# Patient Record
Sex: Male | Born: 1980
Health system: Southern US, Community
[De-identification: ages and names within clinical notes are randomized; demographics above are authoritative.]

## PROBLEM LIST (undated history)

## (undated) DIAGNOSIS — R2231 Localized swelling, mass and lump, right upper limb: Secondary | ICD-10-CM

## (undated) DIAGNOSIS — F410 Panic disorder [episodic paroxysmal anxiety] without agoraphobia: Secondary | ICD-10-CM

## (undated) DIAGNOSIS — K5900 Constipation, unspecified: Secondary | ICD-10-CM

---

## 2013-02-28 ENCOUNTER — Encounter: Payer: Self-pay | Admitting: Family Medicine

## 2013-02-28 ENCOUNTER — Ambulatory Visit (INDEPENDENT_AMBULATORY_CARE_PROVIDER_SITE_OTHER): Payer: BC Managed Care – PPO | Admitting: Family Medicine

## 2013-02-28 VITALS — BP 132/72 | HR 69 | Temp 98.1°F | Ht 70.0 in | Wt 165.0 lb

## 2013-02-28 DIAGNOSIS — Z0000 Encounter for general adult medical examination without abnormal findings: Secondary | ICD-10-CM

## 2013-02-28 DIAGNOSIS — F329 Major depressive disorder, single episode, unspecified: Secondary | ICD-10-CM

## 2013-02-28 DIAGNOSIS — F32A Depression, unspecified: Secondary | ICD-10-CM | POA: Insufficient documentation

## 2013-02-28 DIAGNOSIS — F3289 Other specified depressive episodes: Secondary | ICD-10-CM

## 2013-02-28 MED ORDER — SERTRALINE HCL 100 MG PO TABS: 100.0000 mg | ORAL_TABLET | Freq: Every day | ORAL | Status: DC

## 2013-02-28 NOTE — Progress Notes (Signed)
  Subjective:    Patient ID: Rodney Ruiz, male    DOB: Jul 15, 1981, 32 y.o.   MRN: [id number]  HPI This 32 y.o. male presents for evaluation of depression. He has been taking zoloft for 13 years and his depression is controlled.  He has not had CPE labs in awhile.  He reports no acute problems.   Review of Systems No chest pain, SOB, HA, dizziness, vision change, N/V, diarrhea, constipation, dysuria, urinary urgency or frequency, myalgias, arthralgias or rash.     Objective:   Physical Exam Vital signs noted  Well developed well nourished male.  HEENT - Head atraumatic Normocephalic                Eyes - PERRLA, Conjuctiva - clear Sclera- Clear EOMI                Ears - EAC's Wnl TM's Wnl Gross Hearing WNL                Nose - Nares patent                 Throat - oropharanx wnl Respiratory - Lungs CTA bilateral Cardiac - RRR S1 and S2 without murmur GI - Abdomen soft Nontender and bowel sounds active x 4 Extremities - No edema. Neuro - Grossly intact.       Assessment & Plan:  Depression - Plan: sertraline (ZOLOFT) 100 MG tablet and controlled.  Routine general medical examination at a health care facility - Plan: POCT CBC, CBC with Differential, Lipid panel, TSH, COMPLETE METABOLIC PANEL WITH GFR.  Discussed doing testicular self exam.  Follow up in one year

## 2013-02-28 NOTE — Patient Instructions (Signed)
Testicular Problems and Self-Exam   Men can examine themselves easily and effectively with positive results. Monthly exams detect problems early and save lives. There are numerous causes of swelling in the testicle. Testicular cancer usually appears as a firm painless lump in the front part of the testicle. This may feel like a dull ache or heavy feeling located in the lower abdomen (belly), groin, or scrotum.   The risk is greater in men with undescended testicles and it is more common in young men. It is responsible for almost a fifth of cancers in males between ages 15 and 34. Other common causes of swellings, lumps, and testicular pain include injuries, inflammation (soreness) from infection, hydrocele, and torsion. These are a few of the reasons to do monthly self-examination of the testicles. The exam only takes minutes and could add years to your life. Get in the habit!   SELF-EXAMINATION OF THE TESTICLES   The testicles are easiest to examine after warm baths or showers and are more difficult to examine when you are cold. This is because the muscles attached to the testicles retract and pull them up higher or into the abdomen. While standing, roll one testicle between the thumb and forefinger. Feel for lumps, swelling, or discomfort. A normal testicle is egg shaped and feels firm. It is smooth and not tender. The spermatic cord can be felt as a firm spaghetti-like cord at the back of the testicle. It is also important to examine your groins. This is the crease between the front of your leg and your abdomen. Also, feel for enlarged lymph nodes (glands). Enlarged nodes are also a cause for you to see your caregiver for evaluation.   Self-examination of the testicles and groin areas on a regular basis will help you to know what your own testicles and groins feel like. This will help you pick up an abnormality (difference) at an earlier stage. Early discovery is the key to curing this cancer or treating other  conditions. Any lump, change, or swelling in the testicle calls for immediate evaluation by your caregiver. Cancer of the testicle does not result in impotence and it does not prevent normal intercourse or prevent having children. If your caregiver feels that medical treatment or chemotherapy could lead to infertility, sperm can be frozen for future use. It is necessary to see a caregiver as soon as possible after the discovery of a lump in a testicle.   Document Released: 11/27/2000 Document Revised: 11/13/2011 Document Reviewed: 08/22/2008   ExitCare® Patient Information ©2014 [employer].

## 2014-02-23 ENCOUNTER — Other Ambulatory Visit: Payer: Self-pay | Admitting: Nurse Practitioner

## 2014-02-27 ENCOUNTER — Encounter: Payer: Self-pay | Admitting: Family Medicine

## 2014-02-27 ENCOUNTER — Ambulatory Visit (INDEPENDENT_AMBULATORY_CARE_PROVIDER_SITE_OTHER): Payer: BC Managed Care – PPO | Admitting: Family Medicine

## 2014-02-27 VITALS — BP 136/67 | HR 76 | Temp 99.7°F | Ht 70.0 in | Wt 160.0 lb

## 2014-02-27 DIAGNOSIS — F32A Depression, unspecified: Secondary | ICD-10-CM

## 2014-02-27 DIAGNOSIS — F3289 Other specified depressive episodes: Secondary | ICD-10-CM

## 2014-02-27 DIAGNOSIS — F329 Major depressive disorder, single episode, unspecified: Secondary | ICD-10-CM

## 2014-02-27 MED ORDER — SERTRALINE HCL 100 MG PO TABS: 100.0000 mg | ORAL_TABLET | Freq: Every day | ORAL | Status: DC

## 2014-02-27 NOTE — Progress Notes (Signed)
   Subjective:    Patient ID: Servando Kyllonen, male    DOB: 03/01/1981, 33 y.o.   MRN: [id number]  HPI This 33 y.o. male presents for evaluation of depression.  He is taking zoloft and it is working well And he denies any untoward side effects.   Review of Systems No chest pain, SOB, HA, dizziness, vision change, N/V, diarrhea, constipation, dysuria, urinary urgency or frequency, myalgias, arthralgias or rash.     Objective:   Physical Exam Vital signs noted  Well developed well nourished male.  HEENT - Head atraumatic Normocephalic                Eyes - PERRLA, Conjuctiva - clear Sclera- Clear EOMI                Ears - EAC's Wnl TM's Wnl Gross Hearing WNL                Nose - Nares patent                 Throat - oropharanx wnl Respiratory - Lungs CTA bilateral Cardiac - RRR S1 and S2 without murmur GI - Abdomen soft Nontender and bowel sounds active x 4 Extremities - No edema. Neuro - Grossly intact.       Assessment & Plan:  Depression - Plan: sertraline (ZOLOFT) 100 MG tablet Po qd #30w/11 rf Follow up prn and in one year.  Lysbeth Penner FNP

## 2014-03-16 ENCOUNTER — Telehealth: Payer: Self-pay | Admitting: Family Medicine

## 2014-03-26 ENCOUNTER — Other Ambulatory Visit: Payer: Self-pay | Admitting: Family Medicine

## 2014-09-16 ENCOUNTER — Ambulatory Visit (INDEPENDENT_AMBULATORY_CARE_PROVIDER_SITE_OTHER): Payer: BLUE CROSS/BLUE SHIELD | Admitting: Family Medicine

## 2014-09-16 ENCOUNTER — Ambulatory Visit (INDEPENDENT_AMBULATORY_CARE_PROVIDER_SITE_OTHER): Payer: BLUE CROSS/BLUE SHIELD

## 2014-09-16 ENCOUNTER — Encounter: Payer: Self-pay | Admitting: Family Medicine

## 2014-09-16 VITALS — BP 128/69 | HR 75 | Temp 99.1°F | Ht 70.0 in | Wt 170.4 lb

## 2014-09-16 DIAGNOSIS — R1012 Left upper quadrant pain: Secondary | ICD-10-CM

## 2014-09-16 DIAGNOSIS — K5900 Constipation, unspecified: Secondary | ICD-10-CM

## 2014-09-16 MED ORDER — LACTULOSE 20 GM/30ML PO SOLN: 20.0000 g | Freq: Two times a day (BID) | ORAL | Status: DC

## 2014-09-16 NOTE — Progress Notes (Signed)
   Subjective:    Patient ID: Rodney Ruiz, male    DOB: 1981/08/06, 34 y.o.   MRN: [id number]  HPI C/o left abdominal pain in LUQ.    Review of Systems  Constitutional: Negative for fever.  HENT: Negative for ear pain.   Eyes: Negative for discharge.  Respiratory: Negative for cough.   Cardiovascular: Negative for chest pain.  Gastrointestinal: Negative for abdominal distention.  Endocrine: Negative for polyuria.  Genitourinary: Negative for difficulty urinating.  Musculoskeletal: Negative for gait problem and neck pain.  Skin: Negative for color change and rash.  Neurological: Negative for speech difficulty and headaches.  Psychiatric/Behavioral: Negative for agitation.       Objective:    BP 128/69 mmHg  Pulse 75  Temp(Src) 99.1 F (37.3 C) (Oral)  Ht 5\' 10"  (1.778 m)  Wt 170 lb 6.4 oz (77.293 kg)  BMI 24.45 kg/m2 Physical Exam  Constitutional: He is oriented to person, place, and time. He appears well-developed and well-nourished.  HENT:  Head: Normocephalic and atraumatic.  Mouth/Throat: Oropharynx is clear and moist.  Eyes: Pupils are equal, round, and reactive to light.  Neck: Normal range of motion. Neck supple.  Cardiovascular: Normal rate and regular rhythm.   No murmur heard. Pulmonary/Chest: Effort normal and breath sounds normal.  Abdominal: Soft. Bowel sounds are normal. There is tenderness.  TTP LUQ  Neurological: He is alert and oriented to person, place, and time.  Skin: Skin is warm and dry.  Psychiatric: He has a normal mood and affect.          Assessment & Plan:     ICD-9-CM ICD-10-CM   1. Left upper quadrant pain 789.02 R10.12 DG Abd 1 View     Lactulose 20 GM/30ML SOLN  2. Constipation, unspecified constipation type 564.00 K59.00 Lactulose 20 GM/30ML SOLN   Drink plenty of water and follow up prn  No Follow-up on file.  Lysbeth Penner FNP

## 2014-10-12 ENCOUNTER — Telehealth: Payer: Self-pay | Admitting: Family Medicine

## 2014-10-12 NOTE — Telephone Encounter (Signed)
Appt scheduled for tomorrow at 3:15 with Dietrich Pates, FNP.  Patient aware.

## 2014-10-13 ENCOUNTER — Encounter: Payer: Self-pay | Admitting: Family Medicine

## 2014-10-13 ENCOUNTER — Encounter (INDEPENDENT_AMBULATORY_CARE_PROVIDER_SITE_OTHER): Payer: Self-pay

## 2014-10-13 ENCOUNTER — Ambulatory Visit (INDEPENDENT_AMBULATORY_CARE_PROVIDER_SITE_OTHER): Payer: BLUE CROSS/BLUE SHIELD | Admitting: Family Medicine

## 2014-10-13 VITALS — BP 128/70 | HR 73 | Temp 98.5°F | Ht 70.0 in | Wt 174.0 lb

## 2014-10-13 DIAGNOSIS — R0789 Other chest pain: Secondary | ICD-10-CM

## 2014-10-13 NOTE — Progress Notes (Signed)
   Subjective:    Patient ID: Rodney Ruiz, male    DOB: 17-Nov-1980, 33 y.o.   MRN: [id number]  HPI Patient is here for follow up and he is c/o LLQ abdominal discomfort.  He had some constipation with his xray and was tx'd with lactulose and is still having some discomfort.  Review of Systems  Constitutional: Negative for fever.  HENT: Negative for ear pain.   Eyes: Negative for discharge.  Respiratory: Negative for cough.   Cardiovascular: Negative for chest pain.  Gastrointestinal: Negative for abdominal distention.  Endocrine: Negative for polyuria.  Genitourinary: Negative for difficulty urinating.  Musculoskeletal: Negative for gait problem and neck pain.  Skin: Negative for color change and rash.  Neurological: Negative for speech difficulty and headaches.  Psychiatric/Behavioral: Negative for agitation.       Objective:    BP 128/70 mmHg  Pulse 73  Temp(Src) 98.5 F (36.9 C) (Oral)  Ht 5\' 10"  (1.778 m)  Wt 174 lb (78.926 kg)  BMI 24.97 kg/m2 Physical Exam  Constitutional: He is oriented to person, place, and time. He appears well-developed and well-nourished.  HENT:  Head: Normocephalic and atraumatic.  Mouth/Throat: Oropharynx is clear and moist.  Eyes: Pupils are equal, round, and reactive to light.  Neck: Normal range of motion. Neck supple.  Cardiovascular: Normal rate and regular rhythm.   No murmur heard. Pulmonary/Chest: Effort normal and breath sounds normal.  Abdominal: Soft. Bowel sounds are normal. There is no tenderness.  Musculoskeletal:  TTP left anterior intercostal  Neurological: He is alert and oriented to person, place, and time.  Skin: Skin is warm and dry.  Psychiatric: He has a normal mood and affect.          Assessment & Plan:     ICD-9-CM ICD-10-CM   1. Chest wall pain 786.52 R07.89    Take motrin from home 2-3 po tid for a week and then need to quit smoking.  Explained why chemicals in cigarettes responsible for chest wall  pain and chondritis.  No Follow-up on file.  Lysbeth Penner FNP

## 2014-10-26 ENCOUNTER — Encounter: Payer: Self-pay | Admitting: Family

## 2014-10-26 ENCOUNTER — Ambulatory Visit (INDEPENDENT_AMBULATORY_CARE_PROVIDER_SITE_OTHER): Payer: BLUE CROSS/BLUE SHIELD | Admitting: Family

## 2014-10-26 VITALS — BP 136/75 | HR 81 | Temp 97.5°F | Ht 70.0 in | Wt 171.8 lb

## 2014-10-26 DIAGNOSIS — K5900 Constipation, unspecified: Secondary | ICD-10-CM

## 2014-10-26 DIAGNOSIS — R1012 Left upper quadrant pain: Secondary | ICD-10-CM

## 2014-10-26 LAB — POCT CBC
GRANULOCYTE PERCENT: 50.8 % (ref 37–80)
HEMATOCRIT: 50.3 % (ref 43.5–53.7)
HEMOGLOBIN: 16.1 g/dL (ref 14.1–18.1)
LYMPH, POC: 3.9 — AB (ref 0.6–3.4)
MCH: 30.2 pg (ref 27–31.2)
MCHC: 32.1 g/dL (ref 31.8–35.4)
MCV: 94.2 fL (ref 80–97)
MPV: 7.4 fL (ref 0–99.8)
POC GRANULOCYTE: 4.6 (ref 2–6.9)
POC LYMPH PERCENT: 42.7 %L (ref 10–50)
Platelet Count, POC: 264 10*3/uL (ref 142–424)
RBC: 5.34 M/uL (ref 4.69–6.13)
RDW, POC: 13.2 %
WBC: 9.1 10*3/uL (ref 4.6–10.2)

## 2014-10-26 MED ORDER — LACTULOSE SOLN: 30.0000 mL | Freq: Two times a day (BID) | Status: DC

## 2014-10-26 NOTE — Patient Instructions (Signed)
Constipation  Constipation is when a person has fewer than three bowel movements a week, has difficulty having a bowel movement, or has stools that are dry, hard, or larger than normal. As people grow older, constipation is more common. If you try to fix constipation with medicines that make you have a bowel movement (laxatives), the problem may get worse. Long-term laxative use may cause the muscles of the colon to become weak. A low-fiber diet, not taking in enough fluids, and taking certain medicines may make constipation worse.   CAUSES    Certain medicines, such as antidepressants, pain medicine, iron supplements, antacids, and water pills.    Certain diseases, such as diabetes, irritable bowel syndrome (IBS), thyroid disease, or depression.    Not drinking enough water.    Not eating enough fiber-rich foods.    Stress or travel.    Lack of physical activity or exercise.    Ignoring the urge to have a bowel movement.    Using laxatives too much.   SIGNS AND SYMPTOMS    Having fewer than three bowel movements a week.    Straining to have a bowel movement.    Having stools that are hard, dry, or larger than normal.    Feeling full or bloated.    Pain in the lower abdomen.    Not feeling relief after having a bowel movement.   DIAGNOSIS   Your health care provider will take a medical history and perform a physical exam. Further testing may be done for severe constipation. Some tests may include:   A barium enema X-ray to examine your rectum, colon, and, sometimes, your small intestine.    A sigmoidoscopy to examine your lower colon.    A colonoscopy to examine your entire colon.  TREATMENT   Treatment will depend on the severity of your constipation and what is causing it. Some dietary treatments include drinking more fluids and eating more fiber-rich foods. Lifestyle treatments may include regular exercise. If these diet and lifestyle recommendations do not help, your health care  provider may recommend taking over-the-counter laxative medicines to help you have bowel movements. Prescription medicines may be prescribed if over-the-counter medicines do not work.   HOME CARE INSTRUCTIONS    Eat foods that have a lot of fiber, such as fruits, vegetables, whole grains, and beans.   Limit foods high in fat and processed sugars, such as french fries, hamburgers, cookies, candies, and soda.    A fiber supplement may be added to your diet if you cannot get enough fiber from foods.    Drink enough fluids to keep your urine clear or pale yellow.    Exercise regularly or as directed by your health care provider.    Go to the restroom when you have the urge to go. Do not hold it.    Only take over-the-counter or prescription medicines as directed by your health care provider. Do not take other medicines for constipation without talking to your health care provider first.   SEEK IMMEDIATE MEDICAL CARE IF:    You have bright red blood in your stool.    Your constipation lasts for more than 4 days or gets worse.    You have abdominal or rectal pain.    You have thin, pencil-like stools.    You have unexplained weight loss.  MAKE SURE YOU:    Understand these instructions.   Will watch your condition.   Will get help right away if you are not   doing well or get worse.  Document Released: 05/19/2004 Document Revised: 08/26/2013 Document Reviewed: 06/02/2013  ExitCare Patient Information 2015 [employer]. This information is not intended to replace advice given to you by your health care provider. Make sure you discuss any questions you have with your health care provider.  Abdominal Pain  Many things can cause abdominal pain. Usually, abdominal pain is not caused by a disease and will improve without treatment. It can often be observed and treated at home. Your health care provider will do a physical exam and possibly order blood tests and X-rays to help determine the seriousness  of your pain. However, in many cases, more time must pass before a clear cause of the pain can be found. Before that point, your health care provider may not know if you need more testing or further treatment.  HOME CARE INSTRUCTIONS   Monitor your abdominal pain for any changes. The following actions may help to alleviate any discomfort you are experiencing:   Only take over-the-counter or prescription medicines as directed by your health care provider.   Do not take laxatives unless directed to do so by your health care provider.   Try a clear liquid diet (broth, tea, or water) as directed by your health care provider. Slowly move to a bland diet as tolerated.  SEEK MEDICAL CARE IF:   You have unexplained abdominal pain.   You have abdominal pain associated with nausea or diarrhea.   You have pain when you urinate or have a bowel movement.   You experience abdominal pain that wakes you in the night.   You have abdominal pain that is worsened or improved by eating food.   You have abdominal pain that is worsened with eating fatty foods.   You have a fever.  SEEK IMMEDIATE MEDICAL CARE IF:    Your pain does not go away within 2 hours.   You keep throwing up (vomiting).   Your pain is felt only in portions of the abdomen, such as the right side or the left lower portion of the abdomen.   You pass bloody or black tarry stools.  MAKE SURE YOU:   Understand these instructions.    Will watch your condition.    Will get help right away if you are not doing well or get worse.   Document Released: 05/31/2005 Document Revised: 08/26/2013 Document Reviewed: 04/30/2013  Exit[employer] Patient Information 2015 [employer]. This information is not intended to replace advice given to you by your health care provider. Make sure you discuss any questions you have with your health care provider.

## 2014-10-26 NOTE — Progress Notes (Signed)
   Subjective:    Patient ID: Rodney Ruiz, male    DOB: Aug 26, 1981, 34 y.o.   MRN: [id number]  HPI Pt presents to the office for LUQ pain behind his left rib. Pt has been seen in the office twice for this pain. Pt given a x-ray and was told he was constipated. Pt states the pain in intermittent 6 out 10 dull pain. Pt states that changing positions helps and motrin.   Review of Systems  Constitutional: Negative.   HENT: Negative.   Respiratory: Negative.   Cardiovascular: Negative.   Gastrointestinal: Positive for abdominal pain.  Endocrine: Negative.   Genitourinary: Negative.   Musculoskeletal: Negative.   Neurological: Negative.   Hematological: Negative.   Psychiatric/Behavioral: Negative.   All other systems reviewed and are negative.      Objective:   Physical Exam  Constitutional: He is oriented to person, place, and time. He appears well-developed and well-nourished. No distress.  HENT:  Head: Normocephalic.  Eyes: Pupils are equal, round, and reactive to light. Right eye exhibits no discharge. Left eye exhibits no discharge.  Neck: Normal range of motion. Neck supple. No thyromegaly present.  Cardiovascular: Normal rate, regular rhythm, normal heart sounds and intact distal pulses.   No murmur heard. Pulmonary/Chest: Effort normal and breath sounds normal. No respiratory distress. He has no wheezes.  Abdominal: Soft. Bowel sounds are normal. He exhibits no distension. There is no tenderness.  Musculoskeletal: Normal range of motion. He exhibits no edema or tenderness.  Neurological: He is alert and oriented to person, place, and time. He has normal reflexes. No cranial nerve deficit.  Skin: Skin is warm and dry. No rash noted. No erythema.  Psychiatric: He has a normal mood and affect. His behavior is normal. Judgment and thought content normal.  Vitals reviewed.    BP 136/75 mmHg  Pulse 81  Temp(Src) 97.5 F (36.4 C) (Oral)  Ht 5\' 10"  (1.778 m)  Wt 171 lb  12.8 oz (77.928 kg)  BMI 24.65 kg/m2      Assessment & Plan:  1. LUQ pain - POCT CBC  2. Constipation, unspecified constipation type -High fiber diet -Force fluids -Go to ED if pain worsens  -RTO prn - Lactulose SOLN; Take 30 mLs by mouth 2 (two) times daily.  Dispense: 500 mL; Refill: [place], FNP

## 2014-12-30 ENCOUNTER — Ambulatory Visit (INDEPENDENT_AMBULATORY_CARE_PROVIDER_SITE_OTHER): Payer: BLUE CROSS/BLUE SHIELD

## 2014-12-30 ENCOUNTER — Encounter: Payer: Self-pay | Admitting: Family Medicine

## 2014-12-30 ENCOUNTER — Ambulatory Visit (INDEPENDENT_AMBULATORY_CARE_PROVIDER_SITE_OTHER): Payer: BLUE CROSS/BLUE SHIELD | Admitting: Family Medicine

## 2014-12-30 VITALS — BP 125/69 | HR 74 | Temp 98.3°F | Ht 70.0 in | Wt 163.0 lb

## 2014-12-30 DIAGNOSIS — R1012 Left upper quadrant pain: Secondary | ICD-10-CM

## 2014-12-30 LAB — POCT CBC
Granulocyte percent: 58.6 %G (ref 37–80)
HEMATOCRIT: 50.9 % (ref 43.5–53.7)
Hemoglobin: 16.5 g/dL (ref 14.1–18.1)
Lymph, poc: 3.3 (ref 0.6–3.4)
MCH: 30.8 pg (ref 27–31.2)
MCHC: 32.5 g/dL (ref 31.8–35.4)
MCV: 94.7 fL (ref 80–97)
MPV: 7.2 fL (ref 0–99.8)
POC Granulocyte: 5.7 (ref 2–6.9)
POC LYMPH PERCENT: 33.9 %L (ref 10–50)
Platelet Count, POC: 222 10*3/uL (ref 142–424)
RBC: 5.38 M/uL (ref 4.69–6.13)
RDW, POC: 12.7 %
WBC: 9.8 10*3/uL (ref 4.6–10.2)

## 2014-12-30 NOTE — Progress Notes (Signed)
Subjective:  Patient ID: Rodney Ruiz, male    DOB: 1980-11-21  Age: 34 y.o. MRN: [id number]  CC: Abdominal Pain   HPI Rodney Ruiz presents for pain has been present for 4 months. it comes and goes intermittently.  History Rodney Ruiz has no past medical history on file.   He has no past surgical history on file.   His family history includes Cancer in his mother.He reports that he has been smoking Cigarettes.  He started smoking about 18 years ago. He has been smoking about 1.00 pack per day. He does not have any smokeless tobacco history on file. He reports that he does not drink alcohol or use illicit drugs.  Current Outpatient Prescriptions on File Prior to Visit  Medication Sig Dispense Refill  . esomeprazole (NEXIUM) 20 MG capsule Take 20 mg by mouth daily at 12 noon.    . sertraline (ZOLOFT) 100 MG tablet Take 1 tablet (100 mg total) by mouth daily. 30 tablet 11   No current facility-administered medications on file prior to visit.    ROS Review of Systems  Constitutional: Negative for fever, chills and diaphoresis.  Respiratory: Negative for cough and shortness of breath.   Cardiovascular: Negative for chest pain.  Gastrointestinal: Positive for abdominal pain. Negative for nausea, vomiting, diarrhea, constipation, blood in stool and abdominal distention.  Genitourinary: Negative for dysuria, hematuria and flank pain.  Musculoskeletal: Negative for joint swelling and arthralgias.  Skin: Negative for rash.  Neurological: Negative for dizziness and weakness.  Psychiatric/Behavioral: The patient is not nervous/anxious.     Objective:  BP 125/69 mmHg  Pulse 74  Temp(Src) 98.3 F (36.8 C) (Oral)  Ht 5' 10"  (1.778 m)  Wt 163 lb (73.936 kg)  BMI 23.39 kg/m2  BP Readings from Last 3 Encounters:  12/30/14 125/69  10/26/14 136/75  10/13/14 128/70    Wt Readings from Last 3 Encounters:  12/30/14 163 lb (73.936 kg)  10/26/14 171 lb 12.8 oz (77.928 kg)  10/13/14 174  lb (78.926 kg)     Physical Exam  Constitutional: He is oriented to person, place, and time. He appears well-developed and well-nourished. No distress.  HENT:  Head: Normocephalic and atraumatic.  Right Ear: External ear normal.  Left Ear: External ear normal.  Nose: Nose normal.  Mouth/Throat: Oropharynx is clear and moist.  Eyes: Conjunctivae and EOM are normal. Pupils are equal, round, and reactive to light.  Neck: Normal range of motion. Neck supple. No thyromegaly present.  Cardiovascular: Normal rate, regular rhythm and normal heart sounds.   No murmur heard. Pulmonary/Chest: Effort normal and breath sounds normal. No respiratory distress. He has no wheezes. He has no rales.  Abdominal: Soft. Bowel sounds are normal. He exhibits no distension. There is no tenderness.  Lymphadenopathy:    He has no cervical adenopathy.  Neurological: He is alert and oriented to person, place, and time. He has normal reflexes.  Skin: Skin is warm and dry.  Psychiatric: He has a normal mood and affect. His behavior is normal. Judgment and thought content normal.    No results found for: HGBA1C  Lab Results  Component Value Date   WBC 9.8 12/30/2014   HGB 16.5 12/30/2014   HCT 50.9 12/30/2014    Patient was never admitted.  Assessment & Plan:   Rodney Ruiz was seen today for abdominal pain.  Diagnoses and all orders for this visit:  LUQ pain Orders: -     POCT CBC -     CMP14+EGFR -  Lipase -     DG Abd Acute W/Chest; Future   I have discontinued Rodney Ruiz Lactulose. I am also having him maintain his sertraline and esomeprazole.  No orders of the defined types were placed in this encounter.     Follow-up: Return in about 6 weeks (around 02/10/2015).  Claretta Fraise, M.D.

## 2014-12-30 NOTE — Patient Instructions (Signed)
MEtamucil 1 tablespoon twice daily from now on. Colace 100 mg capsule twice daily Save lactulose for times when you have no BM for at least 3-4 days.

## 2014-12-31 LAB — CMP14+EGFR
A/G RATIO: 1.7 (ref 1.1–2.5)
ALT: 14 IU/L (ref 0–44)
AST: 14 IU/L (ref 0–40)
Albumin: 4.5 g/dL (ref 3.5–5.5)
Alkaline Phosphatase: 118 IU/L — ABNORMAL HIGH (ref 39–117)
BUN / CREAT RATIO: 13 (ref 8–19)
BUN: 10 mg/dL (ref 6–20)
Bilirubin Total: 0.3 mg/dL (ref 0.0–1.2)
CO2: 26 mmol/L (ref 18–29)
CREATININE: 0.75 mg/dL — AB (ref 0.76–1.27)
Calcium: 9.9 mg/dL (ref 8.7–10.2)
Chloride: 100 mmol/L (ref 97–108)
GFR calc non Af Amer: 119 mL/min/{1.73_m2} (ref >59)
GFR, EST AFRICAN AMERICAN: 137 mL/min/{1.73_m2} (ref >59)
GLOBULIN, TOTAL: 2.6 g/dL (ref 1.5–4.5)
Glucose: 105 mg/dL — ABNORMAL HIGH (ref 65–99)
Potassium: 4.4 mmol/L (ref 3.5–5.2)
SODIUM: 141 mmol/L (ref 134–144)
Total Protein: 7.1 g/dL (ref 6.0–8.5)

## 2014-12-31 LAB — LIPASE: LIPASE: 33 U/L (ref 0–59)

## 2015-01-07 ENCOUNTER — Ambulatory Visit (HOSPITAL_COMMUNITY): Admission: RE | Admit: 2015-01-07 | Payer: BLUE CROSS/BLUE SHIELD | Source: Ambulatory Visit

## 2015-03-23 ENCOUNTER — Other Ambulatory Visit: Payer: Self-pay | Admitting: *Deleted

## 2015-03-23 DIAGNOSIS — F32A Depression, unspecified: Secondary | ICD-10-CM

## 2015-03-23 DIAGNOSIS — F329 Major depressive disorder, single episode, unspecified: Secondary | ICD-10-CM

## 2015-03-23 MED ORDER — SERTRALINE HCL 100 MG PO TABS: 100.0000 mg | ORAL_TABLET | Freq: Every day | ORAL | Status: DC

## 2015-04-19 ENCOUNTER — Other Ambulatory Visit: Payer: Self-pay | Admitting: Family Medicine

## 2015-06-23 ENCOUNTER — Other Ambulatory Visit: Payer: Self-pay | Admitting: Family Medicine

## 2015-06-23 NOTE — Telephone Encounter (Signed)
Last seen 12/30/14  Dr Stacks 

## 2015-07-21 ENCOUNTER — Other Ambulatory Visit: Payer: Self-pay | Admitting: Family Medicine

## 2015-07-22 ENCOUNTER — Other Ambulatory Visit: Payer: Self-pay | Admitting: Family Medicine

## 2015-08-20 ENCOUNTER — Encounter (HOSPITAL_BASED_OUTPATIENT_CLINIC_OR_DEPARTMENT_OTHER): Payer: Self-pay | Admitting: *Deleted

## 2015-08-20 ENCOUNTER — Ambulatory Visit (HOSPITAL_BASED_OUTPATIENT_CLINIC_OR_DEPARTMENT_OTHER): Payer: Worker's Compensation | Admitting: Anesthesiology

## 2015-08-20 ENCOUNTER — Ambulatory Visit (HOSPITAL_BASED_OUTPATIENT_CLINIC_OR_DEPARTMENT_OTHER)
Admission: RE | Admit: 2015-08-20 | Discharge: 2015-08-20 | Disposition: A | Discharge status: Home / Self Care | Payer: Worker's Compensation | Source: Ambulatory Visit | Attending: Orthopedic Surgery | Admitting: Orthopedic Surgery

## 2015-08-20 ENCOUNTER — Encounter (HOSPITAL_BASED_OUTPATIENT_CLINIC_OR_DEPARTMENT_OTHER)
Admission: RE | Disposition: A | Discharge status: Home / Self Care | Payer: Self-pay | Source: Ambulatory Visit | Attending: Orthopedic Surgery

## 2015-08-20 DIAGNOSIS — S62632B Displaced fracture of distal phalanx of right middle finger, initial encounter for open fracture: Secondary | ICD-10-CM | POA: Diagnosis not present

## 2015-08-20 DIAGNOSIS — S67196A Crushing injury of right little finger, initial encounter: Secondary | ICD-10-CM | POA: Diagnosis not present

## 2015-08-20 DIAGNOSIS — S62634B Displaced fracture of distal phalanx of right ring finger, initial encounter for open fracture: Secondary | ICD-10-CM | POA: Diagnosis not present

## 2015-08-20 DIAGNOSIS — S67194A Crushing injury of right ring finger, initial encounter: Secondary | ICD-10-CM | POA: Diagnosis not present

## 2015-08-20 DIAGNOSIS — W3189XA Contact with other specified machinery, initial encounter: Secondary | ICD-10-CM | POA: Insufficient documentation

## 2015-08-20 DIAGNOSIS — F17210 Nicotine dependence, cigarettes, uncomplicated: Secondary | ICD-10-CM | POA: Insufficient documentation

## 2015-08-20 DIAGNOSIS — K219 Gastro-esophageal reflux disease without esophagitis: Secondary | ICD-10-CM | POA: Insufficient documentation

## 2015-08-20 DIAGNOSIS — S67192A Crushing injury of right middle finger, initial encounter: Secondary | ICD-10-CM | POA: Diagnosis not present

## 2015-08-20 DIAGNOSIS — Z79899 Other long term (current) drug therapy: Secondary | ICD-10-CM | POA: Diagnosis not present

## 2015-08-20 DIAGNOSIS — Y990 Civilian activity done for income or pay: Secondary | ICD-10-CM | POA: Diagnosis not present

## 2015-08-20 HISTORY — DX: Constipation, unspecified: K59.00

## 2015-08-20 HISTORY — DX: Panic disorder (episodic paroxysmal anxiety): F41.0

## 2015-08-20 HISTORY — PX: CLOSED REDUCTION FINGER WITH PERCUTANEOUS PINNING: SHX5612

## 2015-08-20 HISTORY — PX: NAILBED REPAIR: SHX5028

## 2015-08-20 SURGERY — REPAIR, NAIL BED
Anesthesia: General | Site: Finger | Laterality: Right

## 2015-08-20 MED ORDER — FENTANYL CITRATE (PF) 100 MCG/2ML IJ SOLN
INTRAMUSCULAR | Status: AC
Start: 2015-08-20 — End: 2015-08-20
  Filled 2015-08-20: qty 2

## 2015-08-20 MED ORDER — MIDAZOLAM HCL 2 MG/2ML IJ SOLN
INTRAMUSCULAR | Status: AC
  Filled 2015-08-20: qty 2

## 2015-08-20 MED ORDER — 0.9 % SODIUM CHLORIDE (POUR BTL) OPTIME
TOPICAL | Status: DC | PRN
  Administered 2015-08-20: 200 mL

## 2015-08-20 MED ORDER — HYDROMORPHONE HCL 1 MG/ML IJ SOLN: 0.2500 mg | INTRAMUSCULAR | Status: DC | PRN

## 2015-08-20 MED ORDER — BUPIVACAINE HCL (PF) 0.25 % IJ SOLN
INTRAMUSCULAR | Status: DC | PRN
  Administered 2015-08-20: 9.5 mL

## 2015-08-20 MED ORDER — CHLORHEXIDINE GLUCONATE 4 % EX LIQD: 60.0000 mL | Freq: Once | CUTANEOUS | Status: DC

## 2015-08-20 MED ORDER — OXYCODONE-ACETAMINOPHEN 5-325 MG PO TABS: 1.0000 | ORAL_TABLET | ORAL | Status: DC | PRN

## 2015-08-20 MED ORDER — MIDAZOLAM HCL 5 MG/5ML IJ SOLN
INTRAMUSCULAR | Status: DC | PRN
  Administered 2015-08-20: 2 mg via INTRAVENOUS

## 2015-08-20 MED ORDER — GLYCOPYRROLATE 0.2 MG/ML IJ SOLN: 0.2000 mg | Freq: Once | INTRAMUSCULAR | Status: DC | PRN

## 2015-08-20 MED ORDER — LACTATED RINGERS IV SOLN
INTRAVENOUS | Status: DC
  Administered 2015-08-20: 15:00:00 via INTRAVENOUS

## 2015-08-20 MED ORDER — SUCCINYLCHOLINE CHLORIDE 20 MG/ML IJ SOLN
INTRAMUSCULAR | Status: AC
  Filled 2015-08-20: qty 1

## 2015-08-20 MED ORDER — LIDOCAINE HCL (CARDIAC) 20 MG/ML IV SOLN
INTRAVENOUS | Status: AC
  Filled 2015-08-20: qty 5

## 2015-08-20 MED ORDER — FENTANYL CITRATE (PF) 100 MCG/2ML IJ SOLN
INTRAMUSCULAR | Status: DC | PRN
  Administered 2015-08-20: 50 ug via INTRAVENOUS
  Administered 2015-08-20: 100 ug via INTRAVENOUS
  Administered 2015-08-20: 50 ug via INTRAVENOUS
  Administered 2015-08-20: 100 ug via INTRAVENOUS

## 2015-08-20 MED ORDER — ONDANSETRON HCL 4 MG/2ML IJ SOLN
INTRAMUSCULAR | Status: AC
  Filled 2015-08-20: qty 2

## 2015-08-20 MED ORDER — GLYCOPYRROLATE 0.2 MG/ML IJ SOLN
INTRAMUSCULAR | Status: AC
  Filled 2015-08-20: qty 1

## 2015-08-20 MED ORDER — CEFAZOLIN SODIUM-DEXTROSE 2-3 GM-% IV SOLR: 2.0000 g | INTRAVENOUS | Status: DC

## 2015-08-20 MED ORDER — CEFAZOLIN SODIUM-DEXTROSE 2-3 GM-% IV SOLR
INTRAVENOUS | Status: AC
  Filled 2015-08-20: qty 50

## 2015-08-20 MED ORDER — ATROPINE SULFATE 0.4 MG/ML IJ SOLN
INTRAMUSCULAR | Status: AC
  Filled 2015-08-20: qty 1

## 2015-08-20 MED ORDER — SCOPOLAMINE 1 MG/3DAYS TD PT72: 1.0000 | MEDICATED_PATCH | Freq: Once | TRANSDERMAL | Status: DC | PRN

## 2015-08-20 MED ORDER — LIDOCAINE HCL (CARDIAC) 20 MG/ML IV SOLN
INTRAVENOUS | Status: DC | PRN
  Administered 2015-08-20: 50 mg via INTRAVENOUS

## 2015-08-20 MED ORDER — ONDANSETRON HCL 4 MG/2ML IJ SOLN
INTRAMUSCULAR | Status: DC | PRN
  Administered 2015-08-20: 4 mg via INTRAVENOUS

## 2015-08-20 MED ORDER — DEXAMETHASONE SODIUM PHOSPHATE 4 MG/ML IJ SOLN
INTRAMUSCULAR | Status: DC | PRN
  Administered 2015-08-20: 10 mg via INTRAVENOUS

## 2015-08-20 MED ORDER — DEXAMETHASONE SODIUM PHOSPHATE 10 MG/ML IJ SOLN
INTRAMUSCULAR | Status: AC
  Filled 2015-08-20: qty 1

## 2015-08-20 MED ORDER — FENTANYL CITRATE (PF) 100 MCG/2ML IJ SOLN: 50.0000 ug | INTRAMUSCULAR | Status: DC | PRN

## 2015-08-20 MED ORDER — PHENYLEPHRINE HCL 10 MG/ML IJ SOLN
INTRAMUSCULAR | Status: AC
  Filled 2015-08-20: qty 1

## 2015-08-20 MED ORDER — CEFAZOLIN SODIUM-DEXTROSE 2-3 GM-% IV SOLR
INTRAVENOUS | Status: DC | PRN
  Administered 2015-08-20 (×2): 2 g via INTRAVENOUS

## 2015-08-20 MED ORDER — SUCCINYLCHOLINE CHLORIDE 20 MG/ML IJ SOLN
INTRAMUSCULAR | Status: DC | PRN
  Administered 2015-08-20: 50 mg via INTRAVENOUS

## 2015-08-20 MED ORDER — EPHEDRINE SULFATE 50 MG/ML IJ SOLN
INTRAMUSCULAR | Status: AC
  Filled 2015-08-20: qty 1

## 2015-08-20 MED ORDER — PROPOFOL 10 MG/ML IV BOLUS
INTRAVENOUS | Status: DC | PRN
  Administered 2015-08-20: 200 mg via INTRAVENOUS

## 2015-08-20 MED ORDER — PROPOFOL 500 MG/50ML IV EMUL
INTRAVENOUS | Status: AC
  Filled 2015-08-20: qty 50

## 2015-08-20 MED ORDER — MIDAZOLAM HCL 2 MG/2ML IJ SOLN: 1.0000 mg | INTRAMUSCULAR | Status: DC | PRN

## 2015-08-20 MED ORDER — LACTATED RINGERS IV SOLN
INTRAVENOUS | Status: DC | PRN
  Administered 2015-08-20 (×2): via INTRAVENOUS

## 2015-08-20 MED ORDER — PROPOFOL 10 MG/ML IV BOLUS
INTRAVENOUS | Status: AC
  Filled 2015-08-20: qty 40

## 2015-08-20 SURGICAL SUPPLY — 53 items
BLADE MINI RND TIP GREEN BEAV (BLADE) IMPLANT
BLADE SURG 15 STRL LF DISP TIS (BLADE) ×1 IMPLANT
BLADE SURG 15 STRL SS (BLADE) ×1
BNDG COHESIVE 1X5 TAN STRL LF (GAUZE/BANDAGES/DRESSINGS) ×4 IMPLANT
BNDG COHESIVE 3X5 TAN STRL LF (GAUZE/BANDAGES/DRESSINGS) IMPLANT
BNDG ESMARK 4X9 LF (GAUZE/BANDAGES/DRESSINGS) IMPLANT
BNDG GAUZE ELAST 4 BULKY (GAUZE/BANDAGES/DRESSINGS) IMPLANT
BRUSH SCRUB EZ PLAIN DRY (MISCELLANEOUS) ×2 IMPLANT
CHLORAPREP W/TINT 26ML (MISCELLANEOUS) ×2 IMPLANT
CORDS BIPOLAR (ELECTRODE) ×2 IMPLANT
COVER BACK TABLE 60X90IN (DRAPES) ×2 IMPLANT
COVER MAYO STAND STRL (DRAPES) ×2 IMPLANT
CUFF TOURNIQUET SINGLE 18IN (TOURNIQUET CUFF) ×2 IMPLANT
DECANTER SPIKE VIAL GLASS SM (MISCELLANEOUS) IMPLANT
DRAPE EXTREMITY T 121X128X90 (DRAPE) ×2 IMPLANT
DRAPE OEC MINIVIEW 54X84 (DRAPES) ×2 IMPLANT
DRAPE SURG 17X23 STRL (DRAPES) ×2 IMPLANT
GAUZE SPONGE 4X4 12PLY STRL (GAUZE/BANDAGES/DRESSINGS) ×2 IMPLANT
GAUZE XEROFORM 1X8 LF (GAUZE/BANDAGES/DRESSINGS) ×2 IMPLANT
GLOVE BIOGEL PI IND STRL 7.0 (GLOVE) ×2 IMPLANT
GLOVE BIOGEL PI IND STRL 8.5 (GLOVE) ×1 IMPLANT
GLOVE BIOGEL PI INDICATOR 7.0 (GLOVE) ×2
GLOVE BIOGEL PI INDICATOR 8.5 (GLOVE) ×1
GLOVE SURG ORTHO 8.0 STRL STRW (GLOVE) ×2 IMPLANT
GLOVE SURG SS PI 6.5 STRL IVOR (GLOVE) ×2 IMPLANT
GOWN STRL REUS W/ TWL LRG LVL3 (GOWN DISPOSABLE) ×1 IMPLANT
GOWN STRL REUS W/TWL LRG LVL3 (GOWN DISPOSABLE) ×1
GOWN STRL REUS W/TWL XL LVL3 (GOWN DISPOSABLE) ×2 IMPLANT
K-WIRE .035X4 (WIRE) ×2 IMPLANT
K-WIRE PROS .028 4 (WIRE) ×4 IMPLANT
NEEDLE PRECISIONGLIDE 27X1.5 (NEEDLE) ×2 IMPLANT
NS IRRIG 1000ML POUR BTL (IV SOLUTION) ×2 IMPLANT
PACK BASIN DAY SURGERY FS (CUSTOM PROCEDURE TRAY) ×2 IMPLANT
PAD CAST 3X4 CTTN HI CHSV (CAST SUPPLIES) IMPLANT
PADDING CAST ABS 4INX4YD NS (CAST SUPPLIES)
PADDING CAST ABS COTTON 4X4 ST (CAST SUPPLIES) IMPLANT
PADDING CAST COTTON 3X4 STRL (CAST SUPPLIES)
SLEEVE SCD COMPRESS KNEE MED (MISCELLANEOUS) IMPLANT
SPLINT FINGER 4.25 BULB 911906 (SOFTGOODS) ×4 IMPLANT
SPLINT PLASTER CAST XFAST 3X15 (CAST SUPPLIES) IMPLANT
SPLINT PLASTER XTRA FASTSET 3X (CAST SUPPLIES)
STOCKINETTE 4X48 STRL (DRAPES) ×2 IMPLANT
SUT CHROMIC 5 0 P 3 (SUTURE) ×2 IMPLANT
SUT CHROMIC 6 0 G 1 (SUTURE) ×2 IMPLANT
SUT ETHILON 4 0 PS 2 18 (SUTURE) IMPLANT
SUT MERSILENE 4 0 P 3 (SUTURE) IMPLANT
SUT VIC AB 4-0 P2 18 (SUTURE) IMPLANT
SUT VICRYL 4-0 PS2 18IN ABS (SUTURE) IMPLANT
SYR BULB 3OZ (MISCELLANEOUS) ×2 IMPLANT
SYR CONTROL 10ML LL (SYRINGE) ×2 IMPLANT
TOWEL OR 17X24 6PK STRL BLUE (TOWEL DISPOSABLE) ×2 IMPLANT
TRAY DSU PREP LF (CUSTOM PROCEDURE TRAY) ×2 IMPLANT
UNDERPAD 30X30 (UNDERPADS AND DIAPERS) ×2 IMPLANT

## 2015-08-20 NOTE — Transfer of Care (Signed)
Immediate Anesthesia Transfer of Care Note  Patient: Rodney Ruiz  Procedure(s) Performed: Procedure(s): NAILBED REPAIR RIGHT LONG RING AND SMALL FINGERS (Right) CLOSED REDUCTION WITH PERCUTANEOUS PINNING DISTAL PHALANX RIGHT LONG AND RING FINGERS (Right)  Patient Location: PACU  Anesthesia Type:General  Level of Consciousness: sedated  Airway & Oxygen Therapy: Patient Spontanous Breathing and Patient connected to face mask oxygen  Post-op Assessment: Report given to RN and Post -op Vital signs reviewed and stable  Post vital signs: Reviewed and stable  Last Vitals:  Filed Vitals:   08/20/15 1503  BP: 152/75  Pulse: 89  Temp: 36.5 C  Resp: 20    Complications: No apparent anesthesia complications

## 2015-08-20 NOTE — Brief Op Note (Signed)
08/20/2015  5:36 PM  PATIENT:  Sherre Poot  34 y.o. male  PRE-OPERATIVE DIAGNOSIS:  crush injury right long and ring finger  POST-OPERATIVE DIAGNOSIS:  crush injury right long ring and small finger; open fracture right long and ring finger  PROCEDURE:  Procedure(s): NAILBED REPAIR RIGHT LONG RING AND SMALL FINGERS (Right) CLOSED REDUCTION WITH PERCUTANEOUS PINNING DISTAL PHALANX RIGHT LONG AND RING FINGERS (Right)  SURGEON:  Surgeon(s) and Role:    * Daryll Brod, MD - Primary  PHYSICIAN ASSISTANT:   ASSISTANTS: none   ANESTHESIA:   local and general  EBL:  Total I/O In: 1500 [I.V.:1500] Out: -   BLOOD ADMINISTERED:none  DRAINS: none   LOCAL MEDICATIONS USED:  BUPIVICAINE   SPECIMEN:  No Specimen  DISPOSITION OF SPECIMEN:  N/A  COUNTS:  YES  TOURNIQUET:   Total Tourniquet Time Documented: Upper Arm (Right) - 32 minutes Total: Upper Arm (Right) - 32 minutes   DICTATION: .Other Dictation: Dictation Number [id number]  PLAN OF CARE: Discharge to home after PACU  PATIENT DISPOSITION:  PACU - hemodynamically stable.

## 2015-08-20 NOTE — Anesthesia Procedure Notes (Addendum)
Procedure Name: Intubation Date/Time: 08/20/2015 4:48 PM Performed by: Toula Moos L Pre-anesthesia Checklist: Patient identified, Emergency Drugs available, Suction available, Patient being monitored and Timeout performed Patient Re-evaluated:Patient Re-evaluated prior to inductionOxygen Delivery Method: Circle System Utilized Preoxygenation: Pre-oxygenation with 100% oxygen Intubation Type: IV induction Ventilation: Mask ventilation without difficulty Laryngoscope Size: Miller and 3 Grade View: Grade III Tube type: Oral Number of attempts: 1 Airway Equipment and Method: Stylet and Oral airway Placement Confirmation: ETT inserted through vocal cords under direct vision,  positive ETCO2 and breath sounds checked- equal and bilateral Tube secured with: Tape Dental Injury: Teeth and Oropharynx as per pre-operative assessment

## 2015-08-20 NOTE — Anesthesia Postprocedure Evaluation (Signed)
Anesthesia Post Note  Patient: Rodney Ruiz  Procedure(s) Performed: Procedure(s) (LRB): NAILBED REPAIR RIGHT LONG RING AND SMALL FINGERS (Right) CLOSED REDUCTION WITH PERCUTANEOUS PINNING DISTAL PHALANX RIGHT LONG AND RING FINGERS (Right)  Patient location during evaluation: PACU Anesthesia Type: General Level of consciousness: awake and alert Pain management: pain level controlled Vital Signs Assessment: post-procedure vital signs reviewed and stable Respiratory status: spontaneous breathing, nonlabored ventilation and respiratory function stable Cardiovascular status: blood pressure returned to baseline and stable Postop Assessment: no signs of nausea or vomiting Anesthetic complications: no    Last Vitals:  Filed Vitals:   08/20/15 1815 08/20/15 1830  BP: 137/76   Pulse: 92   Temp:  36.9 C  Resp: 15     Last Pain:  Filed Vitals:   08/20/15 1830  PainSc: 0-No pain                 Patsey Pitstick,W. EDMOND

## 2015-08-20 NOTE — Anesthesia Preprocedure Evaluation (Addendum)
Anesthesia Evaluation  Patient identified by MRN, date of birth, ID band Patient awake    Reviewed: Allergy & Precautions, H&P , NPO status , Patient's Chart, lab work & pertinent test results  Airway Mallampati: II  TM Distance: >3 FB Neck ROM: Full    Dental no notable dental hx. (+) Teeth Intact, Dental Advisory Given   Pulmonary Current Smoker,    Pulmonary exam normal breath sounds clear to auscultation       Cardiovascular negative cardio ROS   Rhythm:Regular Rate:Normal     Neuro/Psych negative neurological ROS  negative psych ROS   GI/Hepatic Neg liver ROS, GERD  ,  Endo/Other  negative endocrine ROS  Renal/GU negative Renal ROS  negative genitourinary   Musculoskeletal   Abdominal   Peds  Hematology negative hematology ROS (+)   Anesthesia Other Findings   Reproductive/Obstetrics negative OB ROS                            Anesthesia Physical Anesthesia Plan  ASA: II and emergent  Anesthesia Plan: General   Post-op Pain Management:    Induction: Intravenous, Rapid sequence and Cricoid pressure planned  Airway Management Planned: Oral ETT  Additional Equipment:   Intra-op Plan:   Post-operative Plan: Extubation in OR  Informed Consent: I have reviewed the patients History and Physical, chart, labs and discussed the procedure including the risks, benefits and alternatives for the proposed anesthesia with the patient or authorized representative who has indicated his/her understanding and acceptance.   Dental advisory given  Plan Discussed with: CRNA  Anesthesia Plan Comments:        Anesthesia Quick Evaluation

## 2015-08-20 NOTE — Discharge Instructions (Addendum)

## 2015-08-20 NOTE — H&P (Signed)
  Rodney Ruiz is an 34 y.o. male.   Chief Complaint: 34 yo male with carding injury to right hand today at work. Injury to distal phalanges middle and ring finger with fractures. HPI: see above  Past Medical History  Diagnosis Date  . Constipation   . Panic attacks     History reviewed. No pertinent past surgical history.  Family History  Problem Relation Age of Onset  . Cancer Mother     breast   Social History:  reports that he has been smoking Cigarettes.  He started smoking about 19 years ago. He has been smoking about 1.00 pack per day. He does not have any smokeless tobacco history on file. He reports that he does not drink alcohol or use illicit drugs.  Allergies: No Known Allergies  Medications Prior to Admission  Medication Sig Dispense Refill  . psyllium (METAMUCIL) 58.6 % powder Take 1 packet by mouth 3 (three) times daily.    . sertraline (ZOLOFT) 100 MG tablet TAKE 1 TABLET (100 MG TOTAL) BY MOUTH DAILY. 30 tablet 0  . esomeprazole (NEXIUM) 20 MG capsule Take 20 mg by mouth daily at 12 noon.      No results found for this or any previous visit (from the past 48 hour(s)).  No results found.   Pertinent items are noted in HPI.  Blood pressure 152/75, pulse 89, temperature 97.7 F (36.5 C), temperature source Oral, resp. rate 20, height 5\' 10"  (1.778 m), weight 76.204 kg (168 lb), SpO2 100 %.  General appearance: alert, cooperative and appears stated age Head: Normocephalic, without obvious abnormality Neck: no JVD Resp: clear to auscultation bilaterally Cardio: regular rate and rhythm, S1, S2 normal, no murmur, click, rub or gallop GI: soft, non-tender; bowel sounds normal; no masses,  no organomegaly Extremities: open injury to middle and ring fingers right hand Pulses: 2+ and symmetric Skin: Skin color, texture, turgor normal. No rashes or lesions Neurologic: Grossly normal Incision/Wound: Open lac middle and ring fingers  Assessment/Plan Carding  injury with open fractures middle and ring fingers right  Repair and pinning fractures right hand   Dazja Houchin R 08/20/2015, 3:17 PM

## 2015-08-20 NOTE — Op Note (Signed)
Dictation Number [id number]

## 2015-08-21 NOTE — Op Note (Signed)
Rodney Ruiz, MCMURRY               ACCOUNT NO.:  [id number]  MEDICAL RECORD NO.:  [id number]  LOCATION:                                 FACILITY:  PHYSICIAN:  Daryll Brod, M.D.            DATE OF BIRTH:  DATE OF PROCEDURE:  08/10/2015 DATE OF DISCHARGE:                              OPERATIVE REPORT   PREOPERATIVE DIAGNOSIS:  Carting injury, right middle ring and small fingers.  POSTOPERATIVE DIAGNOSIS:  Carting injury, right middle ring and small fingers.  OPERATION:  Repair of nail beds, middle ring and small fingers, with repair of open fractures, pinning of distal phalanges of the middle and ring fingers.  SURGEON:  Daryll Brod, M.D.  ANESTHESIA:  General with metacarpal block.  ANESTHESIOLOGIST:  Dr. Ola Spurr.  HISTORY:  The patient is a 34 year old male, who got his right hand caught in the carting machine, suffering injury with fractures of his middle and ring finger, open injuries with nail bed injury.  Fractures are present on the middle and ring.  There is an injury to the small finger.  He was admitted for repair of nail beds, pinning of the distal phalangeal fracture.  Pre, peri, and postoperative course have been discussed along with risks and complications.  He is aware there is no guarantee with the surgery, possibility of infection; recurrence of injury to arteries, nerves, tendons; irregularity of nail beds and plates on regrowing.  In the preoperative area, the patient was seen, the extremity marked by both patient and surgeon.  Antibiotic given.  PROCEDURE IN DETAIL:  The patient was brought to the operating room, where general anesthetic was carried out without difficulty.  He was prepped using Betadine scrub and solution,in supine position with the right arm free.  Time-out taken, confirming the patient and procedure.  The limb was exsanguinated with an Esmarch bandage. Tourniquet placed high on the arm, was inflated to 250 mmHg.  The nail plates were  removed from each of the fingers revealing nail bed injuries to each.  The middle and ring fingers showed significant proximal injuries with multiple lacerations through the nail matrix, dorsally and palmarly in the proximal aspects of each.  The wounds were copiously irrigated with saline.  The distal phalanx was pinned including the interphalangeal joint.  This was done to the middle finger with 2 crossed 0.028 K-wires and that there was a split down the center of the bone.  The ring finger was pinned with a longitudinal 0.035 K-wire.  X- rays confirmed positioning of the these in good position, with stabilization of the fracture fragments stabilizing them to the interphalangeal joint. The pins were bent, cut short.  The nail matrix was then repaired with interrupted 6-0 chromic sutures distally and horizontal mattress 6-0 chromic proximally to repair the nail fold.  The nail fold completions were done with 5-0 chromic sutures and the skin.  This was done to the middle finger.  The ring finger had similar injury.  The longitudinal distal portion was repaired with interrupted 6-0, horizontal mattress were used for the proximal nail fold to coapt the dorsal palmar nail matrix, brought out through the skin  proximally and the skin laceration repaired with interrupted 5-0 chromic sutures.  The small finger had a longitudinal laceration through the nail bed, this was repaired with interrupted 6-0 chromic.  Nonadherent gauze was placed into the nail fold proximally.  The pins bent and cut short. Metacarpal blocks were given using 10 cc of marcaine. Sterile compressive dressings were applied to each of the fingers with splint to the middle and ring. Deflation of the tourniquet, remaining fingers pinked.  He was taken to the recovery room for observation in satisfactory condition.  He will be discharged home, to return to the [facility] in 1 week, on Percocet.           ______________________________ Daryll Brod, M.D.     GK/MEDQ  D:  08/20/2015  T:  08/20/2015  Job:  [id number]

## 2015-08-21 NOTE — Op Note (Signed)
Intra-operative fluoroscopic images in the AP, lateral, and oblique views were taken and evaluated by myself.  Reduction and hardware placement were confirmed.

## 2015-08-22 ENCOUNTER — Other Ambulatory Visit: Payer: Self-pay | Admitting: Family Medicine

## 2015-08-23 ENCOUNTER — Encounter (HOSPITAL_BASED_OUTPATIENT_CLINIC_OR_DEPARTMENT_OTHER): Payer: Self-pay | Admitting: Orthopedic Surgery

## 2015-09-30 ENCOUNTER — Ambulatory Visit: Payer: BLUE CROSS/BLUE SHIELD | Admitting: Family Medicine

## 2015-10-01 ENCOUNTER — Ambulatory Visit (INDEPENDENT_AMBULATORY_CARE_PROVIDER_SITE_OTHER): Payer: BLUE CROSS/BLUE SHIELD | Admitting: Family Medicine

## 2015-10-01 ENCOUNTER — Encounter: Payer: Self-pay | Admitting: Family Medicine

## 2015-10-01 VITALS — BP 131/69 | HR 75 | Temp 97.8°F | Ht 70.0 in | Wt 176.2 lb

## 2015-10-01 DIAGNOSIS — R1012 Left upper quadrant pain: Secondary | ICD-10-CM

## 2015-10-01 DIAGNOSIS — K5900 Constipation, unspecified: Secondary | ICD-10-CM

## 2015-10-01 MED ORDER — DOCUSATE SODIUM 100 MG PO CAPS: 100.0000 mg | ORAL_CAPSULE | Freq: Two times a day (BID) | ORAL | Status: DC

## 2015-10-01 MED ORDER — PSYLLIUM 58.6 % PO POWD: 1.0000 | Freq: Three times a day (TID) | ORAL | Status: DC

## 2015-10-01 MED ORDER — SERTRALINE HCL 100 MG PO TABS: ORAL_TABLET | ORAL | Status: DC

## 2015-10-01 NOTE — Progress Notes (Signed)
Subjective:  Patient ID: Rodney Ruiz, male    DOB: 13-Feb-1981  Age: 35 y.o. MRN: [id number]  CC: Abdominal Pain   HPI Rodney Ruiz presents for 3/10 ache, recurring X 1 yr. LUQ present 5 days out of 7 on average since onset. No nausea or diarrhea. BMs irregular and Rodney Ruiz didi get temporary relief with lactulose last year.   History Rodney Ruiz has a past medical history of Constipation and Panic attacks.   Rodney Ruiz has past surgical history that includes Nailbed repair (Right, 08/20/2015) and Closed reduction finger with percutaneous pinning (Right, 08/20/2015).   His family history includes Cancer in his mother.Rodney Ruiz reports that Rodney Ruiz has been smoking Cigarettes.  Rodney Ruiz started smoking about 19 years ago. Rodney Ruiz has been smoking about 1.00 pack per day. Rodney Ruiz does not have any smokeless tobacco history on file. Rodney Ruiz reports that Rodney Ruiz does not drink alcohol or use illicit drugs.    ROS Review of Systems  Constitutional: Negative for fever, chills, diaphoresis and unexpected weight change.  HENT: Negative for congestion, hearing loss, rhinorrhea and sore throat.   Eyes: Negative for visual disturbance.  Respiratory: Negative for cough and shortness of breath.   Cardiovascular: Negative for chest pain.  Gastrointestinal: Positive for abdominal pain. Negative for nausea, vomiting, diarrhea, blood in stool, abdominal distention and rectal pain.  Genitourinary: Negative for dysuria and flank pain.  Musculoskeletal: Negative for joint swelling and arthralgias.  Skin: Negative for rash.  Neurological: Negative for dizziness and headaches.  Psychiatric/Behavioral: Negative for sleep disturbance and dysphoric mood.    Objective:  BP 131/69 mmHg  Pulse 75  Temp(Src) 97.8 F (36.6 C) (Oral)  Ht '5\' 10"'$  (1.778 m)  Wt 176 lb 3.2 oz (79.924 kg)  BMI 25.28 kg/m2  BP Readings from Last 3 Encounters:  10/01/15 131/69  08/20/15 138/78  12/30/14 125/69    Wt Readings from Last 3 Encounters:  10/01/15 176 lb 3.2 oz  (79.924 kg)  08/20/15 168 lb (76.204 kg)  12/30/14 163 lb (73.936 kg)     Physical Exam  Constitutional: Rodney Ruiz is oriented to person, place, and time. Rodney Ruiz appears well-developed and well-nourished. No distress.  HENT:  Head: Normocephalic and atraumatic.  Right Ear: External ear normal.  Left Ear: External ear normal.  Nose: Nose normal.  Mouth/Throat: Oropharynx is clear and moist.  Eyes: Conjunctivae and EOM are normal. Pupils are equal, round, and reactive to light.  Neck: Normal range of motion. Neck supple. No thyromegaly present.  Cardiovascular: Normal rate, regular rhythm and normal heart sounds.   No murmur heard. Pulmonary/Chest: Effort normal and breath sounds normal. No respiratory distress. Rodney Ruiz has no wheezes. Rodney Ruiz has no rales.  Abdominal: Soft. Bowel sounds are normal. Rodney Ruiz exhibits no distension. There is no tenderness.  Lymphadenopathy:    Rodney Ruiz has no cervical adenopathy.  Neurological: Rodney Ruiz is alert and oriented to person, place, and time. Rodney Ruiz has normal reflexes.  Skin: Skin is warm and dry.  Psychiatric: Rodney Ruiz has a normal mood and affect. His behavior is normal. Judgment and thought content normal.     Lab Results  Component Value Date   WBC 9.8 12/30/2014   HGB 16.5 12/30/2014   HCT 50.9 12/30/2014   GLUCOSE 105* 12/30/2014   ALT 14 12/30/2014   AST 14 12/30/2014   NA 141 12/30/2014   K 4.4 12/30/2014   CL 100 12/30/2014   CREATININE 0.75* 12/30/2014   BUN 10 12/30/2014   CO2 26 12/30/2014    No results found.  Assessment & Plan:   Yuan was seen today for abdominal pain.  Diagnoses and all orders for this visit:  LUQ pain -     US Abdomen Complete; Future -     Amylase -     CBC with Differential/Platelet -     CMP14+EGFR -     Lipase  Constipation, unspecified constipation type -     US Abdomen Complete; Future  Other orders -     psyllium (METAMUCIL SMOOTH TEXTURE) 58.6 % powder; Take 1 packet by mouth 3 (three) times daily. -     docusate  sodium (COLACE) 100 MG capsule; Take 1 capsule (100 mg total) by mouth 2 (two) times daily.      I have discontinued Rodney Ruiz psyllium and oxyCODONE-acetaminophen. I am also having him start on psyllium and docusate sodium. Additionally, I am having him maintain his esomeprazole and sertraline.  Meds ordered this encounter  Medications  . psyllium (METAMUCIL SMOOTH TEXTURE) 58.6 % powder    Sig: Take 1 packet by mouth 3 (three) times daily.    Dispense:  283 g    Refill:  12  . docusate sodium (COLACE) 100 MG capsule    Sig: Take 1 capsule (100 mg total) by mouth 2 (two) times daily.    Dispense:  60 capsule    Refill:  11     Follow-up: Return in about 6 weeks (around 11/12/2015).  Claretta Fraise, M.D.

## 2015-10-01 NOTE — Addendum Note (Signed)
Addended by: Jamelle Haring on: 10/01/2015 04:47 PM   Modules accepted: Orders

## 2015-10-02 LAB — CMP14+EGFR
A/G RATIO: 1.6 (ref 1.1–2.5)
ALT: 31 IU/L (ref 0–44)
AST: 23 IU/L (ref 0–40)
Albumin: 4.5 g/dL (ref 3.5–5.5)
Alkaline Phosphatase: 132 IU/L — ABNORMAL HIGH (ref 39–117)
BILIRUBIN TOTAL: 0.4 mg/dL (ref 0.0–1.2)
BUN/Creatinine Ratio: 10 (ref 8–19)
BUN: 8 mg/dL (ref 6–20)
CHLORIDE: 100 mmol/L (ref 96–106)
CO2: 27 mmol/L (ref 18–29)
Calcium: 9.6 mg/dL (ref 8.7–10.2)
Creatinine, Ser: 0.82 mg/dL (ref 0.76–1.27)
GFR calc Af Amer: 132 mL/min/{1.73_m2} (ref >59)
GFR calc non Af Amer: 114 mL/min/{1.73_m2} (ref >59)
Globulin, Total: 2.8 g/dL (ref 1.5–4.5)
Glucose: 80 mg/dL (ref 65–99)
POTASSIUM: 4.4 mmol/L (ref 3.5–5.2)
SODIUM: 140 mmol/L (ref 134–144)
Total Protein: 7.3 g/dL (ref 6.0–8.5)

## 2015-10-02 LAB — LIPASE: Lipase: 35 U/L (ref 0–59)

## 2015-10-02 LAB — CBC WITH DIFFERENTIAL/PLATELET
BASOS ABS: 0.1 10*3/uL (ref 0.0–0.2)
Basos: 1 %
EOS (ABSOLUTE): 0.2 10*3/uL (ref 0.0–0.4)
EOS: 3 %
HEMATOCRIT: 48.5 % (ref 37.5–51.0)
Hemoglobin: 17.1 g/dL (ref 12.6–17.7)
Immature Grans (Abs): 0 10*3/uL (ref 0.0–0.1)
Immature Granulocytes: 0 %
LYMPHS ABS: 3 10*3/uL (ref 0.7–3.1)
Lymphs: 33 %
MCH: 32.6 pg (ref 26.6–33.0)
MCHC: 35.3 g/dL (ref 31.5–35.7)
MCV: 92 fL (ref 79–97)
MONOS ABS: 0.7 10*3/uL (ref 0.1–0.9)
Monocytes: 8 %
NEUTROS ABS: 4.9 10*3/uL (ref 1.4–7.0)
Neutrophils: 55 %
PLATELETS: 245 10*3/uL (ref 150–379)
RBC: 5.25 x10E6/uL (ref 4.14–5.80)
RDW: 13.7 % (ref 12.3–15.4)
WBC: 8.9 10*3/uL (ref 3.4–10.8)

## 2015-10-02 LAB — AMYLASE: AMYLASE: 55 U/L (ref 31–124)

## 2015-10-04 ENCOUNTER — Telehealth: Payer: Self-pay | Admitting: Family Medicine

## 2015-10-04 NOTE — Telephone Encounter (Signed)
Patient aware of results.

## 2015-10-05 ENCOUNTER — Encounter: Payer: Self-pay | Admitting: *Deleted

## 2015-10-12 ENCOUNTER — Ambulatory Visit (HOSPITAL_COMMUNITY)
Admission: RE | Admit: 2015-10-12 | Discharge: 2015-10-12 | Disposition: A | Discharge status: Home / Self Care | Payer: BLUE CROSS/BLUE SHIELD | Source: Ambulatory Visit | Attending: Family Medicine | Admitting: Family Medicine

## 2015-10-12 DIAGNOSIS — R1012 Left upper quadrant pain: Secondary | ICD-10-CM | POA: Diagnosis present

## 2015-10-12 DIAGNOSIS — K5900 Constipation, unspecified: Secondary | ICD-10-CM

## 2015-10-20 ENCOUNTER — Encounter: Payer: Self-pay | Admitting: *Deleted

## 2015-10-22 ENCOUNTER — Telehealth: Payer: Self-pay | Admitting: *Deleted

## 2015-10-22 NOTE — Telephone Encounter (Signed)
Multiple attempts made to contact patient.  This encounter will now be closed  

## 2015-10-22 NOTE — Telephone Encounter (Signed)
-----   Message from Claretta Fraise, MD sent at 10/12/2015 10:04 AM EST ----- Rodney Ruiz, Your abdominal ultrasound is normal. Best Regards, Claretta Fraise, M.D.

## 2015-11-03 DIAGNOSIS — R2231 Localized swelling, mass and lump, right upper limb: Secondary | ICD-10-CM

## 2015-11-03 HISTORY — DX: Localized swelling, mass and lump, right upper limb: R22.31

## 2015-11-12 ENCOUNTER — Ambulatory Visit: Payer: BLUE CROSS/BLUE SHIELD | Admitting: Family Medicine

## 2015-11-15 ENCOUNTER — Ambulatory Visit: Payer: BLUE CROSS/BLUE SHIELD | Admitting: Family Medicine

## 2015-11-16 ENCOUNTER — Encounter: Payer: Self-pay | Admitting: Family Medicine

## 2015-11-23 ENCOUNTER — Other Ambulatory Visit: Payer: Self-pay | Admitting: Orthopedic Surgery

## 2015-11-26 ENCOUNTER — Other Ambulatory Visit: Payer: Self-pay | Admitting: Orthopedic Surgery

## 2015-12-02 ENCOUNTER — Encounter (HOSPITAL_BASED_OUTPATIENT_CLINIC_OR_DEPARTMENT_OTHER): Payer: Self-pay | Admitting: *Deleted

## 2015-12-05 ENCOUNTER — Other Ambulatory Visit: Payer: Self-pay | Admitting: Family Medicine

## 2015-12-09 ENCOUNTER — Ambulatory Visit (HOSPITAL_BASED_OUTPATIENT_CLINIC_OR_DEPARTMENT_OTHER)
Admission: RE | Admit: 2015-12-09 | Discharge: 2015-12-09 | Disposition: A | Discharge status: Home / Self Care | Payer: Worker's Compensation | Source: Ambulatory Visit | Attending: Orthopedic Surgery | Admitting: Orthopedic Surgery

## 2015-12-09 ENCOUNTER — Ambulatory Visit (HOSPITAL_BASED_OUTPATIENT_CLINIC_OR_DEPARTMENT_OTHER): Payer: Worker's Compensation | Admitting: Certified Registered"

## 2015-12-09 ENCOUNTER — Encounter (HOSPITAL_BASED_OUTPATIENT_CLINIC_OR_DEPARTMENT_OTHER)
Admission: RE | Disposition: A | Discharge status: Home / Self Care | Payer: Self-pay | Source: Ambulatory Visit | Attending: Orthopedic Surgery

## 2015-12-09 ENCOUNTER — Encounter (HOSPITAL_BASED_OUTPATIENT_CLINIC_OR_DEPARTMENT_OTHER): Payer: Self-pay | Admitting: Certified Registered"

## 2015-12-09 DIAGNOSIS — D1801 Hemangioma of skin and subcutaneous tissue: Secondary | ICD-10-CM | POA: Insufficient documentation

## 2015-12-09 DIAGNOSIS — F17210 Nicotine dependence, cigarettes, uncomplicated: Secondary | ICD-10-CM | POA: Diagnosis not present

## 2015-12-09 DIAGNOSIS — R229 Localized swelling, mass and lump, unspecified: Secondary | ICD-10-CM | POA: Diagnosis present

## 2015-12-09 HISTORY — PX: MASS EXCISION: SHX2000

## 2015-12-09 HISTORY — DX: Localized swelling, mass and lump, right upper limb: R22.31

## 2015-12-09 SURGERY — EXCISION MASS
Anesthesia: General | Site: Hand | Laterality: Right

## 2015-12-09 MED ORDER — DEXAMETHASONE SODIUM PHOSPHATE 10 MG/ML IJ SOLN
INTRAMUSCULAR | Status: AC
  Filled 2015-12-09: qty 1

## 2015-12-09 MED ORDER — HYDROMORPHONE HCL 1 MG/ML IJ SOLN: 0.2500 mg | INTRAMUSCULAR | Status: DC | PRN

## 2015-12-09 MED ORDER — FENTANYL CITRATE (PF) 100 MCG/2ML IJ SOLN
INTRAMUSCULAR | Status: AC
  Filled 2015-12-09: qty 2

## 2015-12-09 MED ORDER — ONDANSETRON HCL 4 MG/2ML IJ SOLN
INTRAMUSCULAR | Status: DC | PRN
  Administered 2015-12-09: 4 mg via INTRAVENOUS

## 2015-12-09 MED ORDER — FENTANYL CITRATE (PF) 100 MCG/2ML IJ SOLN
50.0000 ug | INTRAMUSCULAR | Status: DC | PRN
  Administered 2015-12-09: 100 ug via INTRAVENOUS

## 2015-12-09 MED ORDER — MIDAZOLAM HCL 2 MG/2ML IJ SOLN
INTRAMUSCULAR | Status: AC
  Filled 2015-12-09: qty 2

## 2015-12-09 MED ORDER — DEXAMETHASONE SODIUM PHOSPHATE 10 MG/ML IJ SOLN
INTRAMUSCULAR | Status: DC | PRN
  Administered 2015-12-09: 10 mg via INTRAVENOUS

## 2015-12-09 MED ORDER — MIDAZOLAM HCL 2 MG/2ML IJ SOLN
1.0000 mg | INTRAMUSCULAR | Status: DC | PRN
  Administered 2015-12-09: 2 mg via INTRAVENOUS

## 2015-12-09 MED ORDER — CEFAZOLIN SODIUM-DEXTROSE 2-4 GM/100ML-% IV SOLN
2.0000 g | INTRAVENOUS | Status: AC
  Administered 2015-12-09: 2 g via INTRAVENOUS

## 2015-12-09 MED ORDER — LIDOCAINE HCL (CARDIAC) 20 MG/ML IV SOLN
INTRAVENOUS | Status: DC | PRN
  Administered 2015-12-09: 40 mg via INTRAVENOUS

## 2015-12-09 MED ORDER — CEFAZOLIN SODIUM-DEXTROSE 2-4 GM/100ML-% IV SOLN
INTRAVENOUS | Status: AC
  Filled 2015-12-09: qty 100

## 2015-12-09 MED ORDER — BUPIVACAINE HCL (PF) 0.25 % IJ SOLN
INTRAMUSCULAR | Status: DC | PRN
  Administered 2015-12-09: 6 mL

## 2015-12-09 MED ORDER — GLYCOPYRROLATE 0.2 MG/ML IJ SOLN: 0.2000 mg | Freq: Once | INTRAMUSCULAR | Status: DC | PRN

## 2015-12-09 MED ORDER — PROPOFOL 10 MG/ML IV BOLUS
INTRAVENOUS | Status: DC | PRN
  Administered 2015-12-09: 50 mg via INTRAVENOUS
  Administered 2015-12-09: 100 mg via INTRAVENOUS
  Administered 2015-12-09: 200 mg via INTRAVENOUS

## 2015-12-09 MED ORDER — PROPOFOL 500 MG/50ML IV EMUL
INTRAVENOUS | Status: AC
  Filled 2015-12-09: qty 50

## 2015-12-09 MED ORDER — CHLORHEXIDINE GLUCONATE 4 % EX LIQD
60.0000 mL | Freq: Once | CUTANEOUS | Status: DC
Start: 2015-12-09 — End: 2015-12-09

## 2015-12-09 MED ORDER — SCOPOLAMINE 1 MG/3DAYS TD PT72: 1.0000 | MEDICATED_PATCH | Freq: Once | TRANSDERMAL | Status: DC | PRN

## 2015-12-09 MED ORDER — LACTATED RINGERS IV SOLN
INTRAVENOUS | Status: DC
  Administered 2015-12-09: 11:00:00 via INTRAVENOUS
  Administered 2015-12-09: 10 mL/h via INTRAVENOUS

## 2015-12-09 SURGICAL SUPPLY — 45 items
BANDAGE COBAN STERILE 2 (GAUZE/BANDAGES/DRESSINGS) ×2 IMPLANT
BLADE SURG 15 STRL LF DISP TIS (BLADE) ×1 IMPLANT
BLADE SURG 15 STRL SS (BLADE) ×1
BNDG COHESIVE 1X5 TAN STRL LF (GAUZE/BANDAGES/DRESSINGS) ×2 IMPLANT
BNDG COHESIVE 3X5 TAN STRL LF (GAUZE/BANDAGES/DRESSINGS) IMPLANT
BNDG ESMARK 4X9 LF (GAUZE/BANDAGES/DRESSINGS) ×2 IMPLANT
BNDG GAUZE ELAST 4 BULKY (GAUZE/BANDAGES/DRESSINGS) IMPLANT
CHLORAPREP W/TINT 26ML (MISCELLANEOUS) ×2 IMPLANT
CORDS BIPOLAR (ELECTRODE) ×2 IMPLANT
COVER BACK TABLE 60X90IN (DRAPES) ×2 IMPLANT
COVER MAYO STAND STRL (DRAPES) ×2 IMPLANT
CUFF TOURNIQUET SINGLE 18IN (TOURNIQUET CUFF) ×2 IMPLANT
DECANTER SPIKE VIAL GLASS SM (MISCELLANEOUS) IMPLANT
DRAIN PENROSE 1/2X12 LTX STRL (WOUND CARE) IMPLANT
DRAPE EXTREMITY T 121X128X90 (DRAPE) ×2 IMPLANT
DRAPE SURG 17X23 STRL (DRAPES) ×2 IMPLANT
GAUZE SPONGE 4X4 12PLY STRL (GAUZE/BANDAGES/DRESSINGS) ×2 IMPLANT
GAUZE XEROFORM 1X8 LF (GAUZE/BANDAGES/DRESSINGS) ×2 IMPLANT
GLOVE BIOGEL PI IND STRL 7.0 (GLOVE) ×1 IMPLANT
GLOVE BIOGEL PI IND STRL 8.5 (GLOVE) ×1 IMPLANT
GLOVE BIOGEL PI INDICATOR 7.0 (GLOVE) ×1
GLOVE BIOGEL PI INDICATOR 8.5 (GLOVE) ×1
GLOVE ECLIPSE 6.5 STRL STRAW (GLOVE) ×2 IMPLANT
GLOVE SURG ORTHO 8.0 STRL STRW (GLOVE) ×2 IMPLANT
GOWN STRL REUS W/ TWL LRG LVL3 (GOWN DISPOSABLE) ×1 IMPLANT
GOWN STRL REUS W/TWL LRG LVL3 (GOWN DISPOSABLE) ×1
GOWN STRL REUS W/TWL XL LVL3 (GOWN DISPOSABLE) ×2 IMPLANT
NDL SAFETY ECLIPSE 18X1.5 (NEEDLE) IMPLANT
NEEDLE HYPO 18GX1.5 SHARP (NEEDLE)
NEEDLE PRECISIONGLIDE 27X1.5 (NEEDLE) ×2 IMPLANT
NS IRRIG 1000ML POUR BTL (IV SOLUTION) ×2 IMPLANT
PACK BASIN DAY SURGERY FS (CUSTOM PROCEDURE TRAY) ×2 IMPLANT
PAD CAST 3X4 CTTN HI CHSV (CAST SUPPLIES) IMPLANT
PADDING CAST COTTON 3X4 STRL (CAST SUPPLIES)
SPLINT FINGER 3.25 911903 (SOFTGOODS) ×2 IMPLANT
SPLINT PLASTER CAST XFAST 3X15 (CAST SUPPLIES) IMPLANT
SPLINT PLASTER XTRA FASTSET 3X (CAST SUPPLIES)
SPONGE GAUZE 4X4 12PLY STER LF (GAUZE/BANDAGES/DRESSINGS) ×2 IMPLANT
STOCKINETTE 4X48 STRL (DRAPES) ×2 IMPLANT
SUT ETHILON 4 0 PS 2 18 (SUTURE) ×2 IMPLANT
SUT VIC AB 4-0 P2 18 (SUTURE) IMPLANT
SYR BULB 3OZ (MISCELLANEOUS) ×2 IMPLANT
SYR CONTROL 10ML LL (SYRINGE) ×2 IMPLANT
TOWEL OR 17X24 6PK STRL BLUE (TOWEL DISPOSABLE) ×2 IMPLANT
UNDERPAD 30X30 (UNDERPADS AND DIAPERS) IMPLANT

## 2015-12-09 NOTE — Anesthesia Procedure Notes (Signed)
Procedure Name: LMA Insertion Date/Time: 12/09/2015 11:31 AM Performed by: Baxter Flattery Pre-anesthesia Checklist: Patient identified, Emergency Drugs available, Suction available and Patient being monitored Patient Re-evaluated:Patient Re-evaluated prior to inductionOxygen Delivery Method: Circle System Utilized Preoxygenation: Pre-oxygenation with 100% oxygen Intubation Type: IV induction Ventilation: Mask ventilation without difficulty LMA: LMA inserted LMA Size: 5.0 Number of attempts: 1 Airway Equipment and Method: Bite block Placement Confirmation: positive ETCO2 and breath sounds checked- equal and bilateral Tube secured with: Tape Dental Injury: Teeth and Oropharynx as per pre-operative assessment

## 2015-12-09 NOTE — Op Note (Signed)
Dictation Number [id number]

## 2015-12-09 NOTE — Brief Op Note (Signed)
12/09/2015  12:09 PM  PATIENT:  Sherre Poot  35 y.o. male  PRE-OPERATIVE DIAGNOSIS:  MASS RIGHT MIDDLE FINGER  POST-OPERATIVE DIAGNOSIS:  MASS RIGHT MIDDLE FINGER  PROCEDURE:  Procedure(s): EXCISION MASS RIGHT MIDDLE FINGER,PROXIMAL INTERPHALANGEAL JOINT (Right)  SURGEON:  Surgeon(s) and Role:    * Daryll Brod, MD - Primary  PHYSICIAN ASSISTANT:   ASSISTANTS: none   ANESTHESIA:   local and general  EBL:  Total I/O In: 800 [I.V.:800] Out: 25 [Blood:25]  BLOOD ADMINISTERED:none  DRAINS: none   LOCAL MEDICATIONS USED:  BUPIVICAINE   SPECIMEN:  Excision  DISPOSITION OF SPECIMEN:  PATHOLOGY  COUNTS:  YES  TOURNIQUET:   Total Tourniquet Time Documented: Forearm (Right) - 12 minutes Total: Forearm (Right) - 12 minutes   DICTATION: .Other Dictation: Dictation Number [id number]  PLAN OF CARE: Discharge to home after PACU  PATIENT DISPOSITION:  PACU - hemodynamically stable.

## 2015-12-09 NOTE — H&P (Signed)
Rodney Ruiz is an 35 y.o. male.   Chief Complaint: mass right middle finger HPI: Rodney Ruiz is a 35 year old right-hand dominant male who presents to our office for evaluation and treatment of a crush injury to his right hand including the tips of the right long, ring and small fingers. Rodney Ruiz is employed at a [employer]. Apparently, got his right hand caught in machinery at work, sustaining a crush injury with open fracture to the tip of the long finger, ring finger and crush injury to the right small finger. He was sent across town to Urgent Care. He was seen and evaluated there and referred to [facility] at the hospital were obtained. He was then referred to our office on an emergent basis for further evaluation and definitive treatment of his hand injuries. He reports that his pain is about a 6/10 prior to his stay at the hospital at which time they massaged his fingers with digital blocks to the wounds and applied bandages. He is seen in our office with his wife. He has developed a soft mass on his right middle finger.He has had his ultrasound done. This reveals a mass approximately 2 x 1 cm highly vascularized solid in nature.   His review of systems was noncontributory except for the above-noted musculoskeletal changes to the right hand.   Past Medical History  Diagnosis Date  . Constipation   . Panic attacks   . Mass of finger of right hand 11/2015    middle finger    Past Surgical History  Procedure Laterality Date  . Nailbed repair Right 08/20/2015    Procedure: NAILBED REPAIR RIGHT LONG RING AND SMALL FINGERS;  Surgeon: Daryll Brod, MD;  Location: [facility];  Service: Orthopedics;  Laterality: Right;  . Closed reduction finger with percutaneous pinning Right 08/20/2015    Procedure: CLOSED REDUCTION WITH PERCUTANEOUS PINNING DISTAL PHALANX RIGHT LONG AND RING FINGERS;  Surgeon: Daryll Brod, MD;  Location: [facility];  Service: Orthopedics;  Laterality: Right;    Family History  Problem Relation Age of Onset  . Cancer Mother     breast   Social History:  reports that he has been smoking Cigarettes.  He started smoking about 19 years ago. He has a 30 pack-year smoking history. He has never used smokeless tobacco. He reports that he does not drink alcohol or use illicit drugs.  Allergies: No Known Allergies  Medications Prior to Admission  Medication Sig Dispense Refill  . psyllium (METAMUCIL) 58.6 % packet Take 1 packet by mouth 2 (two) times daily.    . sertraline (ZOLOFT) 100 MG tablet TAKE 1 TABLET (100 MG TOTAL) BY MOUTH DAILY. NEEDS APPT 30 tablet 0    No results found for this or any previous visit (from the past 48 hour(s)).  No results found.   Pertinent items are noted in HPI.  Height 5\' 11"  (1.803 m), weight 78.926 kg (174 lb).  General appearance: alert, cooperative and appears stated age Head: Normocephalic, without obvious abnormality Neck: no JVD Resp: clear to auscultation bilaterally Cardio: regular rate and rhythm, S1, S2 normal, no murmur, click, rub or gallop GI: soft, non-tender; bowel sounds normal; no masses,  no organomegaly Extremities: mass PIP dorsal right middle finger Pulses: 2+ and symmetric Skin: Skin color, texture, turgor normal. No rashes or lesions Neurologic: Grossly normal Incision/Wound: healed  Assessment/Plan Dx: mass on his right middle finger  We have discussed with him the possibility of  surgical excision with him. Pre and postoperative course were discussed along with risks and complications. He is aware that there is no guarantee with the surgery, possibility of infection, recurrence, injury to arteries, nerves, tendons and complete relief of symptoms dystrophy. This may be a vascular tumor rather than a giant cell tumor. Their questions are encouraged and answered. He is scheduled for excision mass PIP joint, right middle finger as an  outpatient under regional anesthesia.   Azoria Abbett R 12/09/2015, 10:52 AM

## 2015-12-09 NOTE — Anesthesia Preprocedure Evaluation (Addendum)
Anesthesia Evaluation  Patient identified by MRN, date of birth, ID band Patient awake    Reviewed: Allergy & Precautions, H&P , NPO status , Patient's Chart, lab work & pertinent test results  Airway Mallampati: I  TM Distance: >3 FB Neck ROM: Full    Dental no notable dental hx. (+) Poor Dentition, Dental Advisory Given   Pulmonary Current Smoker,    Pulmonary exam normal breath sounds clear to auscultation       Cardiovascular negative cardio ROS   Rhythm:Regular Rate:Normal     Neuro/Psych Anxiety Depression negative neurological ROS     GI/Hepatic negative GI ROS, Neg liver ROS,   Endo/Other  negative endocrine ROS  Renal/GU negative Renal ROS  negative genitourinary   Musculoskeletal   Abdominal   Peds  Hematology negative hematology ROS (+)   Anesthesia Other Findings   Reproductive/Obstetrics negative OB ROS                            Anesthesia Physical Anesthesia Plan  ASA: II  Anesthesia Plan: General   Post-op Pain Management:    Induction: Intravenous  Airway Management Planned: LMA  Additional Equipment:   Intra-op Plan:   Post-operative Plan: Extubation in OR  Informed Consent: I have reviewed the patients History and Physical, chart, labs and discussed the procedure including the risks, benefits and alternatives for the proposed anesthesia with the patient or authorized representative who has indicated his/her understanding and acceptance.   Dental advisory given  Plan Discussed with: CRNA  Anesthesia Plan Comments:        Anesthesia Quick Evaluation

## 2015-12-09 NOTE — Anesthesia Postprocedure Evaluation (Signed)
Anesthesia Post Note  Patient: Rodney Ruiz  Procedure(s) Performed: Procedure(s) (LRB): EXCISION MASS RIGHT MIDDLE FINGER,PROXIMAL INTERPHALANGEAL JOINT (Right)  Patient location during evaluation: PACU Anesthesia Type: General Level of consciousness: awake and alert Pain management: pain level controlled Vital Signs Assessment: post-procedure vital signs reviewed and stable Respiratory status: spontaneous breathing, nonlabored ventilation and respiratory function stable Cardiovascular status: blood pressure returned to baseline and stable Postop Assessment: no signs of nausea or vomiting Anesthetic complications: no    Last Vitals:  Filed Vitals:   12/09/15 1230 12/09/15 1245  BP: 123/82 126/75  Pulse: 55 59  Temp:    Resp: 10 10    Last Pain:  Filed Vitals:   12/09/15 1257  PainSc: 0-No pain                 [name],W. EDMOND

## 2015-12-09 NOTE — Discharge Instructions (Addendum)

## 2015-12-09 NOTE — Transfer of Care (Signed)
Immediate Anesthesia Transfer of Care Note  Patient: Rodney Ruiz  Procedure(s) Performed: Procedure(s): EXCISION MASS RIGHT MIDDLE FINGER,PROXIMAL INTERPHALANGEAL JOINT (Right)  Patient Location: PACU  Anesthesia Type:General  Level of Consciousness: awake, alert  and patient cooperative  Airway & Oxygen Therapy: Patient Spontanous Breathing and Patient connected to face mask oxygen  Post-op Assessment: Report given to RN, Post -op Vital signs reviewed and stable and Patient moving all extremities  Post vital signs: Reviewed and stable  Last Vitals:  Filed Vitals:   12/09/15 1203 12/09/15 1204  BP: 120/86   Pulse: 48 70  Temp:    Resp: 23 17    Complications: No apparent anesthesia complications

## 2015-12-10 ENCOUNTER — Encounter (HOSPITAL_BASED_OUTPATIENT_CLINIC_OR_DEPARTMENT_OTHER): Payer: Self-pay | Admitting: Orthopedic Surgery

## 2015-12-10 NOTE — Op Note (Signed)
NAMESHUN, MARUNA NO.:  [id number]  MEDICAL RECORD NO.:  [id number]  LOCATION:                                 FACILITY:  PHYSICIAN:  Daryll Brod, M.D.       DATE OF BIRTH:  Feb 02, 1981  DATE OF PROCEDURE:  12/09/2015 DATE OF DISCHARGE:  12/09/2015                              OPERATIVE REPORT   PREOPERATIVE DIAGNOSIS:  Mass, proximal interphalangeal joint, dorsal aspect, right middle finger.  POSTOPERATIVE DIAGNOSIS:  Mass, proximal interphalangeal joint, dorsal aspect, right middle finger.  OPERATION:  Excisional biopsy of mass, right middle finger.  SURGEON:  Daryll Brod, M.D.  ANESTHESIA:  General with metacarpal block.  ANESTHESIOLOGIST:  Soledad Gerlach, MD.  PLACE OF SURGERY:  [facility].  HISTORY:  The patient is a 35 year old male, who suffered an injury to his middle, ring fingers of his right hand.  Approximately 4 months ago, he has developed a mass on the dorsal aspect of the PIP joint of his right middle finger.  He has had ultrasound done revealing the vascular in nature of this, it is fairly large, soft in consistency, is desirous of having it removed.  Pre, peri, and postoperative course have been discussed along with risks and complications.  He is aware there was no guarantee with the surgery; possibility of infection; recurrence of injury to arteries, nerves, tendons; incomplete relief of symptoms; dystrophy.  In the preoperative area, the patient is seen, the extremity marked by both patient and surgeon and antibiotic given.  PROCEDURE IN DETAIL:  The patient was brought to the operating room, where general anesthetic was carried out without difficulty under the direction of Dr. Ola Spurr.  He was prepped using Betadine scrub and solution, and then ChloraPrep, right arm free.  A 3-minute dry time was allowed.  Time-out taken, confirming the patient and procedure.  The limb was exsanguinated with an Esmarch bandage.   Tourniquet placed high on the arm was inflated to 250 mmHg.  A curvilinear incision was made over the proximal and middle phalanges of the right middle finger, carried down through the subcutaneous tissue.  Large multilobulated mass was immediately encountered.  This had blood vessels entering proximally and distally to it, and it was red blood-filled, measured approximately 2.5 cm in length and nearly 1 cm in diameter.  With blunt and sharp dissection, it was dissected free.  Bleeding vessels were electrocauterized with bipolar and the entire mass was excised and sent to Pathology.  Care was taken to protect the neurovascular structures; otherwise, did not enter into the joint.  The wound was copiously irrigated with saline.  The skin was then closed with interrupted 4-0 nylon sutures.  Metacarpal block was given 0.25% bupivacaine without epinephrine, approximately 6 mL was used.  A sterile compressive dressing splint to the finger was applied. On deflation of the tourniquet, remaining fingers became immediately pink.  He was taken to the recovery room for observation in satisfactory condition. He will be discharged to home to return to [place] in 1 week.  He has pain medicine to take at home from his prior injury.  ______________________________ Daryll Brod, M.D.     GK/MEDQ  D:  12/09/2015  T:  12/09/2015  Job:  [id number]

## 2015-12-14 ENCOUNTER — Encounter (HOSPITAL_BASED_OUTPATIENT_CLINIC_OR_DEPARTMENT_OTHER): Payer: Self-pay | Admitting: Orthopedic Surgery

## 2016-01-04 ENCOUNTER — Other Ambulatory Visit: Payer: Self-pay | Admitting: Family Medicine

## 2016-02-07 ENCOUNTER — Other Ambulatory Visit: Payer: Self-pay | Admitting: Family Medicine

## 2016-02-07 NOTE — Telephone Encounter (Signed)
Last seen 10/01/15  Dr Livia Snellen

## 2016-02-09 DIAGNOSIS — D1800 Hemangioma unspecified site: Secondary | ICD-10-CM | POA: Insufficient documentation

## 2016-03-08 ENCOUNTER — Other Ambulatory Visit: Payer: Self-pay | Admitting: Family Medicine

## 2016-03-10 ENCOUNTER — Ambulatory Visit (INDEPENDENT_AMBULATORY_CARE_PROVIDER_SITE_OTHER): Payer: BLUE CROSS/BLUE SHIELD | Admitting: Family Medicine

## 2016-03-10 ENCOUNTER — Ambulatory Visit: Payer: BLUE CROSS/BLUE SHIELD | Admitting: Family Medicine

## 2016-03-10 ENCOUNTER — Encounter: Payer: Self-pay | Admitting: Family Medicine

## 2016-03-10 VITALS — BP 120/63 | HR 69 | Temp 97.7°F | Ht 71.0 in | Wt 175.0 lb

## 2016-03-10 DIAGNOSIS — F329 Major depressive disorder, single episode, unspecified: Secondary | ICD-10-CM

## 2016-03-10 DIAGNOSIS — F32A Depression, unspecified: Secondary | ICD-10-CM

## 2016-03-10 MED ORDER — SERTRALINE HCL 100 MG PO TABS: ORAL_TABLET | ORAL | Status: DC

## 2016-03-10 NOTE — Progress Notes (Signed)
   Subjective:    Patient ID: Rodney Ruiz, male    DOB: [birth year], 35 y.o.   MRN: [id number]  HPI Patient here today for follow up and refill on depression medication. Even though he takes Zoloft is more for anxiety and panic disorder than depression. He has been on it about 14 years. He tried to reduce the dose at one point but had more breakthrough panic attacks so he's been on 100 mg and it still works. He denies any issues with sexual dysfunction with it.   Depression screen [facility] 2/9 03/10/2016 12/30/2014  Decreased Interest 0 0  Down, Depressed, Hopeless 0 0  PHQ - 2 Score 0 0      Patient Active Problem List   Diagnosis Date Noted  . Depression 02/28/2013   Outpatient Encounter Prescriptions as of 03/10/2016  Medication Sig  . psyllium (METAMUCIL) 58.6 % packet Take 1 packet by mouth 2 (two) times daily.  . sertraline (ZOLOFT) 100 MG tablet TAKE 1 TABLET (100 MG TOTAL) BY MOUTH DAILY. NEEDS APPT  . [DISCONTINUED] sertraline (ZOLOFT) 100 MG tablet TAKE 1 TABLET (100 MG TOTAL) BY MOUTH DAILY. NEEDS APPT   No facility-administered encounter medications on file as of 03/10/2016.      Review of Systems  Constitutional: Negative.   HENT: Negative.   Eyes: Negative.   Respiratory: Negative.   Cardiovascular: Negative.   Gastrointestinal: Negative.   Endocrine: Negative.   Genitourinary: Negative.   Musculoskeletal: Negative.   Skin: Negative.   Allergic/Immunologic: Negative.   Neurological: Negative.   Hematological: Negative.   Psychiatric/Behavioral: Negative.        Objective:   Physical Exam  Constitutional: He is oriented to person, place, and time. He appears well-developed and well-nourished.  Neurological: He is alert and oriented to person, place, and time.  Psychiatric: He has a normal mood and affect. His behavior is normal. Judgment and thought content normal.   BP 120/63 mmHg  Pulse 69  Temp(Src) 97.7 F (36.5 C) (Oral)  Ht 5\' 11"  (1.803 m)  Wt 175 lb  (79.379 kg)  BMI 24.42 kg/m2        Assessment & Plan:  1. Depression Refill Zoloft 100 mg 3 month supply with 3 refills discussed tolerance and potential need to increase dose since he has been on sotalol but will leave the dose the same as long as it's working  Wardell Honour MD

## 2016-12-12 ENCOUNTER — Ambulatory Visit (INDEPENDENT_AMBULATORY_CARE_PROVIDER_SITE_OTHER): Payer: BLUE CROSS/BLUE SHIELD | Admitting: Family Medicine

## 2016-12-12 ENCOUNTER — Encounter: Payer: Self-pay | Admitting: Family Medicine

## 2016-12-12 VITALS — BP 128/80 | HR 94 | Temp 98.1°F | Ht 71.0 in | Wt 170.0 lb

## 2016-12-12 DIAGNOSIS — F418 Other specified anxiety disorders: Secondary | ICD-10-CM

## 2016-12-12 DIAGNOSIS — F329 Major depressive disorder, single episode, unspecified: Secondary | ICD-10-CM

## 2016-12-12 DIAGNOSIS — F32A Depression, unspecified: Secondary | ICD-10-CM

## 2016-12-12 DIAGNOSIS — R5383 Other fatigue: Secondary | ICD-10-CM

## 2016-12-12 MED ORDER — DULOXETINE HCL 60 MG PO CPEP: 60.0000 mg | ORAL_CAPSULE | Freq: Every day | ORAL | 2 refills | Status: DC

## 2016-12-12 NOTE — Progress Notes (Signed)
Subjective:  Patient ID: Rodney Ruiz, male    DOB: [birth year]  Age: 36 y.o. MRN: [id number]  CC: Depression (pt here today for routine follow up on his depression and also noticed a knot on right side of his chest.)   HPI Keldon Lassen presents for painless knot. Recently noted at R ant axillary line. Level of anterior rib 4-5 Depression screen [facility] 2/9 12/12/2016 03/10/2016 12/30/2014  Decreased Interest 3 0 0  Down, Depressed, Hopeless 3 0 0  PHQ - 2 Score 6 0 0  Altered sleeping 2 - -  Tired, decreased energy 3 - -  Change in appetite 1 - -  Feeling bad or failure about yourself  2 - -  Trouble concentrating 1 - -  Moving slowly or fidgety/restless 0 - -  Suicidal thoughts 0 - -  PHQ-9 Score 15 - -   Long term use of zoloft - 20 years. No longer effective. Broke down crying at work [employer] two days. Doesn't know why.Feels anxious as well. Has episodes of feeling short of breath overnight hat awaken him feeling panicked. Feels tired all the time. Barely able to complete his daily routine.  History Abhiraj has a past medical history of Constipation; Mass of finger of right hand (11/2015); and Panic attacks.   He has a past surgical history that includes Nailbed repair (Right, 08/20/2015); Closed reduction finger with percutaneous pinning (Right, 08/20/2015); and Mass excision (Right, 12/09/2015).   His family history includes Cancer in his mother.He reports that he has been smoking Cigarettes.  He started smoking about 20 years ago. He has a 30.00 pack-year smoking history. He has never used smokeless tobacco. He reports that he does not drink alcohol or use drugs.    ROS Review of Systems  Constitutional: Negative for chills, diaphoresis and fever.  HENT: Negative for rhinorrhea and sore throat.   Respiratory: Negative for cough and shortness of breath.   Cardiovascular: Negative for chest pain.  Gastrointestinal: Negative for abdominal pain.  Musculoskeletal: Negative for  arthralgias and myalgias.  Skin: Negative for rash.  Neurological: Negative for weakness and headaches.    Objective:  BP 128/80   Pulse 94   Temp 98.1 F (36.7 C) (Oral)   Ht 5' 11"  (1.803 m)   Wt 170 lb (77.1 kg)   BMI 23.71 kg/m   BP Readings from Last 3 Encounters:  12/12/16 128/80  03/10/16 120/63  12/09/15 128/73    Wt Readings from Last 3 Encounters:  12/12/16 170 lb (77.1 kg)  03/10/16 175 lb (79.4 kg)  12/09/15 169 lb (76.7 kg)     Physical Exam  Constitutional: He appears well-developed and well-nourished.  HENT:  Head: Normocephalic and atraumatic.  Right Ear: Tympanic membrane and external ear normal. No decreased hearing is noted.  Left Ear: Tympanic membrane and external ear normal. No decreased hearing is noted.  Mouth/Throat: No oropharyngeal exudate or posterior oropharyngeal erythema.  Eyes: Pupils are equal, round, and reactive to light.  Neck: Normal range of motion. Neck supple.  Cardiovascular: Normal rate and regular rhythm.   No murmur heard. Pulmonary/Chest: Breath sounds normal. No respiratory distress.  Abdominal: Soft. Bowel sounds are normal. He exhibits no mass. There is no tenderness.  Psychiatric: Thought content normal. His affect is labile (tearful). His speech is rapid and/or pressured. He is agitated (fidgety). Cognition and memory are normal.  Vitals reviewed.     Assessment & Plan:   Sabien was seen today for depression.  Diagnoses and  all orders for this visit:  Depression with anxiety -     CBC with Differential/Platelet -     CMP14+EGFR -     TSH  Fatigue due to depression -     CBC with Differential/Platelet -     CMP14+EGFR -     TSH  Other orders -     DULoxetine (CYMBALTA) 60 MG capsule; Take 1 capsule (60 mg total) by mouth daily.       I have discontinued Mr. Corlew sertraline. I am also having him start on DULoxetine. Additionally, I am having him maintain his psyllium.  Allergies as of  12/12/2016   No Known Allergies     Medication List       Accurate as of 12/12/16 10:55 PM. Always use your most recent med list.          DULoxetine 60 MG capsule Commonly known as:  CYMBALTA Take 1 capsule (60 mg total) by mouth daily.   psyllium 58.6 % packet Commonly known as:  METAMUCIL Take 1 packet by mouth 2 (two) times daily.        Follow-up: Return in about 2 weeks (around 12/26/2016).  Claretta Fraise, M.D.

## 2016-12-13 LAB — CMP14+EGFR
A/G RATIO: 1.6 (ref 1.2–2.2)
ALT: 17 IU/L (ref 0–44)
AST: 17 IU/L (ref 0–40)
Albumin: 4.6 g/dL (ref 3.5–5.5)
Alkaline Phosphatase: 125 IU/L — ABNORMAL HIGH (ref 39–117)
BUN/Creatinine Ratio: 13 (ref 9–20)
BUN: 10 mg/dL (ref 6–20)
Bilirubin Total: 0.5 mg/dL (ref 0.0–1.2)
CHLORIDE: 100 mmol/L (ref 96–106)
CO2: 28 mmol/L (ref 18–29)
Calcium: 10 mg/dL (ref 8.7–10.2)
Creatinine, Ser: 0.79 mg/dL (ref 0.76–1.27)
GFR, EST AFRICAN AMERICAN: 133 mL/min/{1.73_m2} (ref >59)
GFR, EST NON AFRICAN AMERICAN: 115 mL/min/{1.73_m2} (ref >59)
GLOBULIN, TOTAL: 2.9 g/dL (ref 1.5–4.5)
GLUCOSE: 95 mg/dL (ref 65–99)
POTASSIUM: 4.5 mmol/L (ref 3.5–5.2)
SODIUM: 144 mmol/L (ref 134–144)
TOTAL PROTEIN: 7.5 g/dL (ref 6.0–8.5)

## 2016-12-13 LAB — CBC WITH DIFFERENTIAL/PLATELET
Basophils Absolute: 0 10*3/uL (ref 0.0–0.2)
Basos: 0 %
EOS (ABSOLUTE): 0.1 10*3/uL (ref 0.0–0.4)
EOS: 1 %
HEMATOCRIT: 47.2 % (ref 37.5–51.0)
HEMOGLOBIN: 16.7 g/dL (ref 13.0–17.7)
IMMATURE GRANS (ABS): 0 10*3/uL (ref 0.0–0.1)
Immature Granulocytes: 0 %
LYMPHS: 27 %
Lymphocytes Absolute: 2.5 10*3/uL (ref 0.7–3.1)
MCH: 32.3 pg (ref 26.6–33.0)
MCHC: 35.4 g/dL (ref 31.5–35.7)
MCV: 91 fL (ref 79–97)
MONOCYTES: 5 %
Monocytes Absolute: 0.4 10*3/uL (ref 0.1–0.9)
NEUTROS ABS: 6.2 10*3/uL (ref 1.4–7.0)
Neutrophils: 67 %
Platelets: 253 10*3/uL (ref 150–379)
RBC: 5.17 x10E6/uL (ref 4.14–5.80)
RDW: 13.2 % (ref 12.3–15.4)
WBC: 9.3 10*3/uL (ref 3.4–10.8)

## 2016-12-13 LAB — TSH: TSH: 1.9 u[IU]/mL (ref 0.450–4.500)

## 2016-12-27 ENCOUNTER — Ambulatory Visit: Payer: BLUE CROSS/BLUE SHIELD | Admitting: Family Medicine

## 2017-02-14 ENCOUNTER — Other Ambulatory Visit: Payer: Self-pay | Admitting: *Deleted

## 2017-02-14 ENCOUNTER — Ambulatory Visit (INDEPENDENT_AMBULATORY_CARE_PROVIDER_SITE_OTHER): Payer: BLUE CROSS/BLUE SHIELD | Admitting: Pediatrics

## 2017-02-14 ENCOUNTER — Encounter: Payer: Self-pay | Admitting: Pediatrics

## 2017-02-14 VITALS — BP 130/76 | HR 84 | Temp 98.1°F | Ht 71.0 in | Wt 173.0 lb

## 2017-02-14 DIAGNOSIS — Z720 Tobacco use: Secondary | ICD-10-CM

## 2017-02-14 DIAGNOSIS — F418 Other specified anxiety disorders: Secondary | ICD-10-CM | POA: Diagnosis not present

## 2017-02-14 MED ORDER — VARENICLINE TARTRATE 0.5 MG X 11 & 1 MG X 42 PO MISC: ORAL | 0 refills | Status: DC

## 2017-02-14 MED ORDER — DULOXETINE HCL 60 MG PO CPEP: 60.0000 mg | ORAL_CAPSULE | Freq: Every day | ORAL | 0 refills | Status: DC

## 2017-02-14 MED ORDER — VARENICLINE TARTRATE 1 MG PO TABS: 1.0000 mg | ORAL_TABLET | Freq: Two times a day (BID) | ORAL | 1 refills | Status: DC

## 2017-02-14 NOTE — Progress Notes (Signed)
  Subjective:   Patient ID: Rodney Ruiz, male    DOB: [birth year], 36 y.o.   MRN: [id number] CC: Nicotine Dependence (Wants to start taking Chantix)  HPI: Rodney Ruiz is a 36 y.o. male presenting for Nicotine Dependence (Wants to start taking Chantix)  Depression: on cymbalta Has been on it for about a month Had been on sertraline before that Thinks helping some with mood  Tobacco use: smoking apprx 1 ppd now Was up to 2 ppd Has quit using chantix in the past Wants to try again Smokes in his car Not in the house Wife also a smoker  Relevant past medical, surgical, family and social history reviewed. Allergies and medications reviewed and updated. History  Smoking Status  . Current Every Day Smoker  . Packs/day: 1.00  . Years: 20.00  . Types: Cigarettes  . Start date: 02/29/1996  Smokeless Tobacco  . Never Used   ROS: Per HPI   Objective:    BP 130/76   Pulse 84   Temp 98.1 F (36.7 C) (Oral)   Ht 5\' 11"  (1.803 m)   Wt 173 lb (78.5 kg)   BMI 24.13 kg/m   Wt Readings from Last 3 Encounters:  02/14/17 173 lb (78.5 kg)  12/12/16 170 lb (77.1 kg)  03/10/16 175 lb (79.4 kg)    Gen: NAD, alert, cooperative with exam, NCAT EYES: EOMI, no conjunctival injection, or no icterus ENT: R TM slightly pink, nl LR, L TM nl, OP without erythema LYMPH: no cervical LAD CV: NRRR, normal S1/S2, no murmur Resp: CTABL, no wheezes, normal WOB Ext: No edema, warm Neuro: Alert and oriented  Assessment & Plan:  Rodney Ruiz was seen today for nicotine dependence.  Diagnoses and all orders for this visit:  Tobacco use Trying to decrease, wants to quit Discussed smoking cessation strategies, start below -     varenicline (CHANTIX STARTING MONTH PAK) 0.5 MG X 11 & 1 MG X 42 tablet; Take one 0.5 mg tablet by mouth once daily for 3 days, then increase to one 0.5 mg tablet twice daily for 4 days, then increase to one 1 mg tablet twice daily. -     varenicline (CHANTIX CONTINUING MONTH PAK)  1 MG tablet; Take 1 tablet (1 mg total) by mouth 2 (two) times daily.  Depression with anxiety Stable, thinks mood has been better since starting the cymbalta  Follow up plan: Return in about 3 months (around 05/17/2017). Assunta Found, MD [place]

## 2017-03-13 ENCOUNTER — Other Ambulatory Visit: Payer: Self-pay | Admitting: Pediatrics

## 2017-03-13 DIAGNOSIS — Z720 Tobacco use: Secondary | ICD-10-CM

## 2017-06-03 ENCOUNTER — Other Ambulatory Visit: Payer: Self-pay | Admitting: Family Medicine

## 2017-06-11 ENCOUNTER — Other Ambulatory Visit: Payer: Self-pay | Admitting: Family Medicine

## 2017-07-24 ENCOUNTER — Ambulatory Visit: Payer: BLUE CROSS/BLUE SHIELD | Admitting: Family Medicine

## 2017-07-24 ENCOUNTER — Encounter: Payer: Self-pay | Admitting: Family Medicine

## 2017-07-24 VITALS — BP 134/73 | HR 78 | Temp 97.0°F | Ht 71.0 in | Wt 182.0 lb

## 2017-07-24 DIAGNOSIS — K2100 Gastro-esophageal reflux disease with esophagitis, without bleeding: Secondary | ICD-10-CM | POA: Insufficient documentation

## 2017-07-24 DIAGNOSIS — F3342 Major depressive disorder, recurrent, in full remission: Secondary | ICD-10-CM | POA: Diagnosis not present

## 2017-07-24 DIAGNOSIS — G479 Sleep disorder, unspecified: Secondary | ICD-10-CM | POA: Diagnosis not present

## 2017-07-24 MED ORDER — PANTOPRAZOLE SODIUM 40 MG PO TBEC: 40.0000 mg | DELAYED_RELEASE_TABLET | Freq: Every day | ORAL | 5 refills | Status: DC

## 2017-07-24 MED ORDER — DULOXETINE HCL 60 MG PO CPEP: ORAL_CAPSULE | ORAL | 5 refills | Status: DC

## 2017-07-24 NOTE — Progress Notes (Signed)
Subjective:  Patient ID: Rodney Ruiz, male    DOB: June 18, 1981  Age: 36 y.o. MRN: [id number]  CC: Depression (pt here today for refills on his cymbalta and also c/o waking up in the middle of the night feeling like his heart is racing and he is gasping for air. He is concerned it might be sleep apnea.)   HPI Rodney Ruiz presents for depression has been stable.  He has not had any feelings of feeling down or hopeless he is performing normal activities energy is good.  No excessive sadness.  No feeling like a failure.  However he is having some spells that are occurring from 1-3 times nightly for the last several weeks and increasing in frequency.  He says that he will wake up Feeling like He get a breath his head up suddenly and take a deep breath and it will go away almost instantly.  Then he can lay down and go back to sleep but it may happen again later during the night.  He does not have chest pain.  He only feels short of breath momentarily.  He is a non-smoker and he does not snore.  That is, his wife has never commented that he is a heavy snorer.  Patient is concerned about sleep apnea.  He also tells me that he has a lot of heartburn it occurs several times a day.  He avoids certain foods.  Depression screen [facility] 2/9 02/14/2017 12/12/2016 03/10/2016  Decreased Interest 0 3 0  Down, Depressed, Hopeless 0 3 0  PHQ - 2 Score 0 6 0  Altered sleeping - 2 -  Tired, decreased energy - 3 -  Change in appetite - 1 -  Feeling bad or failure about yourself  - 2 -  Trouble concentrating - 1 -  Moving slowly or fidgety/restless - 0 -  Suicidal thoughts - 0 -  PHQ-9 Score - 15 -    History Rodney Ruiz has a past medical history of Constipation, Mass of finger of right hand (11/2015), and Panic attacks.   He has a past surgical history that includes Nailbed repair (Right, 08/20/2015); Closed reduction finger with percutaneous pinning (Right, 08/20/2015); and Mass excision (Right, 12/09/2015).   His  family history includes Cancer in his mother.He reports that he has been smoking cigarettes.  He started smoking about 21 years ago. He has a 20.00 pack-year smoking history. he has never used smokeless tobacco. He reports that he does not drink alcohol or use drugs.    ROS Review of Systems  Constitutional: Negative for chills, diaphoresis, fever and unexpected weight change.  HENT: Negative for congestion, hearing loss, rhinorrhea and sore throat.   Eyes: Negative for visual disturbance.  Respiratory: Positive for shortness of breath. Negative for cough.   Cardiovascular: Negative for chest pain.  Gastrointestinal: Positive for abdominal pain (Heartburn). Negative for constipation and diarrhea.  Genitourinary: Negative for dysuria and flank pain.  Musculoskeletal: Negative for arthralgias and joint swelling.  Skin: Negative for rash.  Neurological: Negative for dizziness and headaches.  Psychiatric/Behavioral: Negative for dysphoric mood and sleep disturbance.    Objective:  BP 134/73   Pulse 78   Temp (!) 97 F (36.1 C) (Oral)   Ht 5\' 11"  (1.803 m)   Wt 182 lb (82.6 kg)   BMI 25.38 kg/m   BP Readings from Last 3 Encounters:  07/24/17 134/73  02/14/17 130/76  12/12/16 128/80    Wt Readings from Last 3 Encounters:  07/24/17 182 lb (  82.6 kg)  02/14/17 173 lb (78.5 kg)  12/12/16 170 lb (77.1 kg)     Physical Exam  Constitutional: He is oriented to person, place, and time. He appears well-developed and well-nourished. No distress.  HENT:  Head: Normocephalic and atraumatic.  Right Ear: External ear normal.  Left Ear: External ear normal.  Nose: Nose normal.  Mouth/Throat: Oropharynx is clear and moist.  Eyes: Conjunctivae and EOM are normal. Pupils are equal, round, and reactive to light.  Neck: Normal range of motion. Neck supple. No thyromegaly present.  Cardiovascular: Normal rate, regular rhythm and normal heart sounds.  No murmur heard. Pulmonary/Chest: Effort  normal and breath sounds normal. No respiratory distress. He has no wheezes. He has no rales.  Abdominal: Soft. Bowel sounds are normal. He exhibits no distension. There is no tenderness.  Lymphadenopathy:    He has no cervical adenopathy.  Neurological: He is alert and oriented to person, place, and time. He has normal reflexes.  Skin: Skin is warm and dry.  Psychiatric: He has a normal mood and affect. His behavior is normal. Judgment and thought content normal.      Assessment & Plan:   Rodney Ruiz was seen today for depression.  Diagnoses and all orders for this visit:  Sleep disturbance -     Nocturnal polysomnography (NPSG); Future  Gastroesophageal reflux disease with esophagitis  Recurrent major depressive disorder, in full remission ([place])  Other orders -     pantoprazole (PROTONIX) 40 MG tablet; Take 1 tablet (40 mg total) by mouth daily. -     DULoxetine (CYMBALTA) 60 MG capsule; TAKE 1 CAPSULE BY MOUTH EVERY DAY       I have discontinued Rodney Ruiz's varenicline and CHANTIX STARTING MONTH PAK. I am also having him start on pantoprazole. Additionally, I am having him maintain his psyllium, ranitidine, and DULoxetine.  Allergies as of 07/24/2017   No Known Allergies     Medication List        Accurate as of 07/24/17 11:07 PM. Always use your most recent med list.          DULoxetine 60 MG capsule Commonly known as:  CYMBALTA TAKE 1 CAPSULE BY MOUTH EVERY DAY   pantoprazole 40 MG tablet Commonly known as:  PROTONIX Take 1 tablet (40 mg total) by mouth daily.   psyllium 58.6 % packet Commonly known as:  METAMUCIL Take 1 packet by mouth 2 (two) times daily.   ranitidine 150 MG capsule Commonly known as:  ZANTAC Take 150 mg by mouth 2 (two) times daily.        Follow-up: No Follow-up on file.  Claretta Fraise, M.D.

## 2017-07-27 IMAGING — US US ABDOMEN COMPLETE
1 series · 14 of 25 positions shown · non-contrast
Comparison: None.

CLINICAL DATA: Left upper quadrant pain.

EXAM:
ABDOMEN ULTRASOUND COMPLETE

[Series 1: us abdomen complete · 0.24mm/px · 14 of 81 slices shown]
[im 1/81]
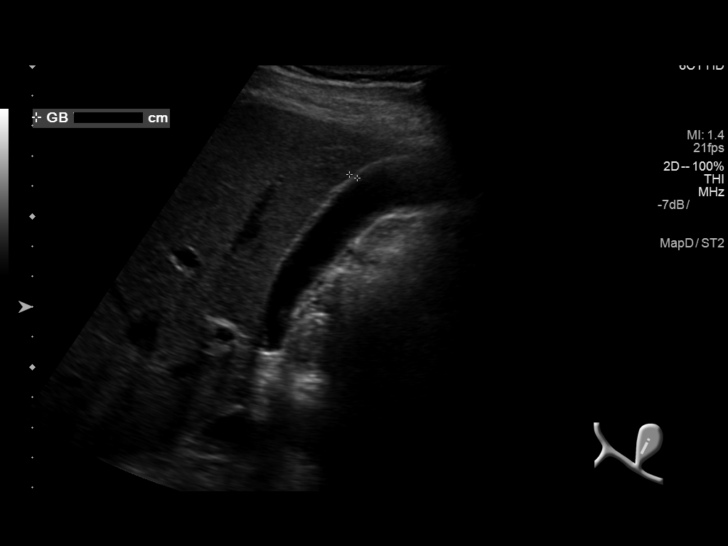
[im 7/81]
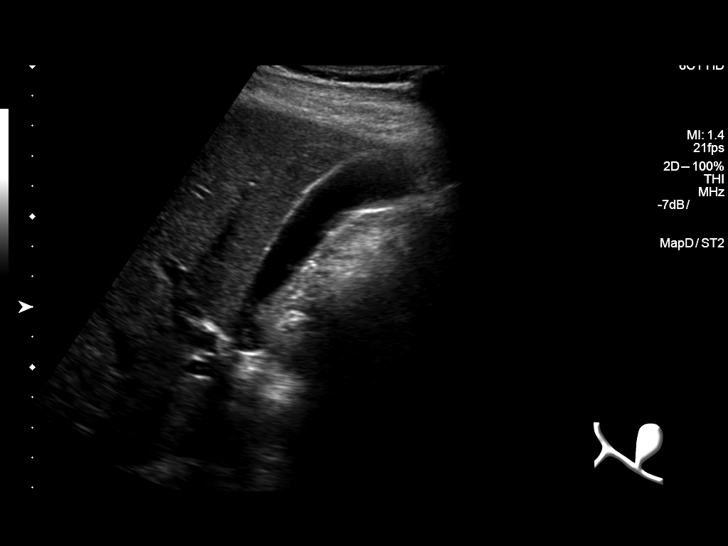
[im 14/81]
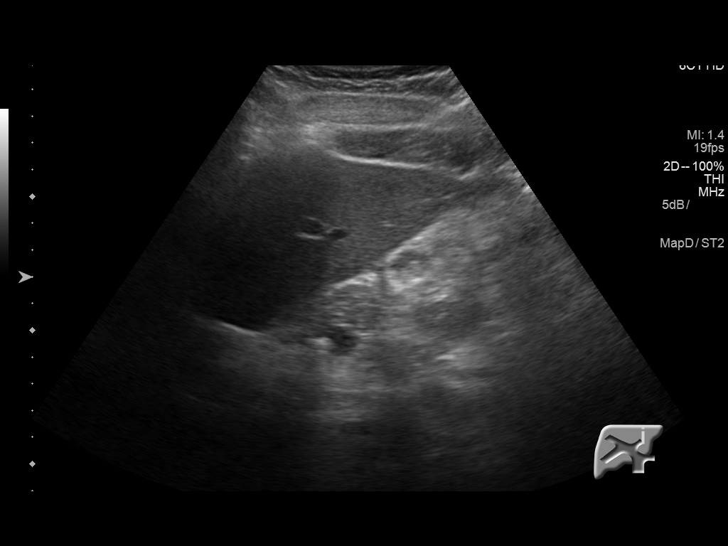
[im 21/81]
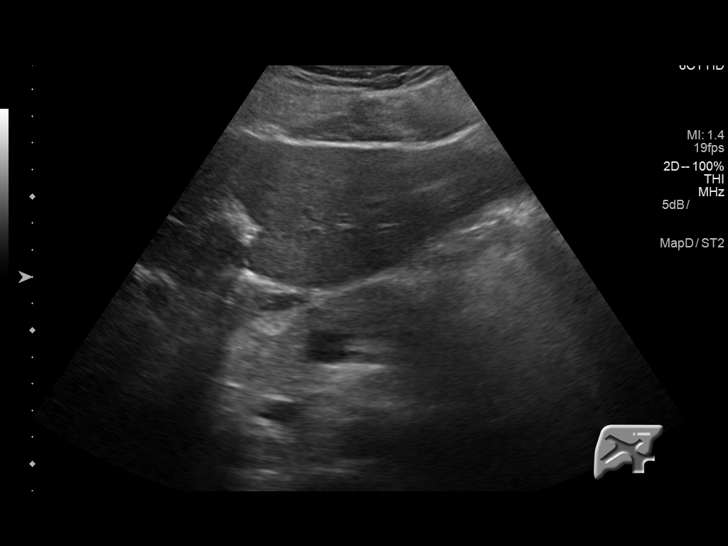
[im 27/81]
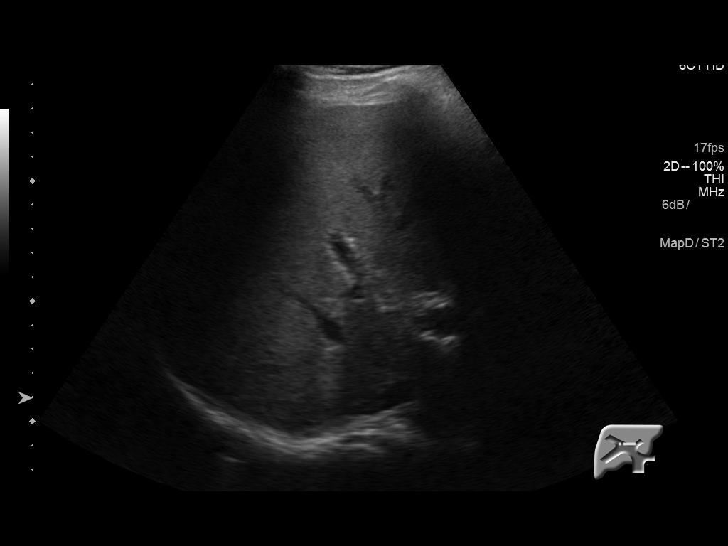
[im 31/81]
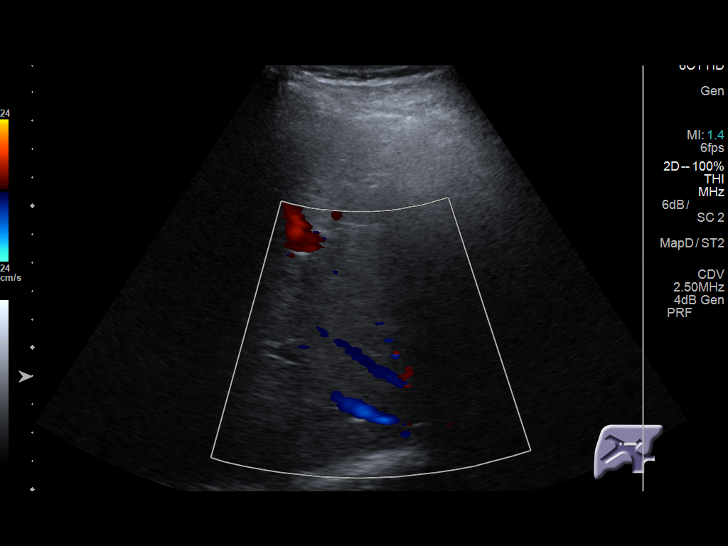
[im 37/81]
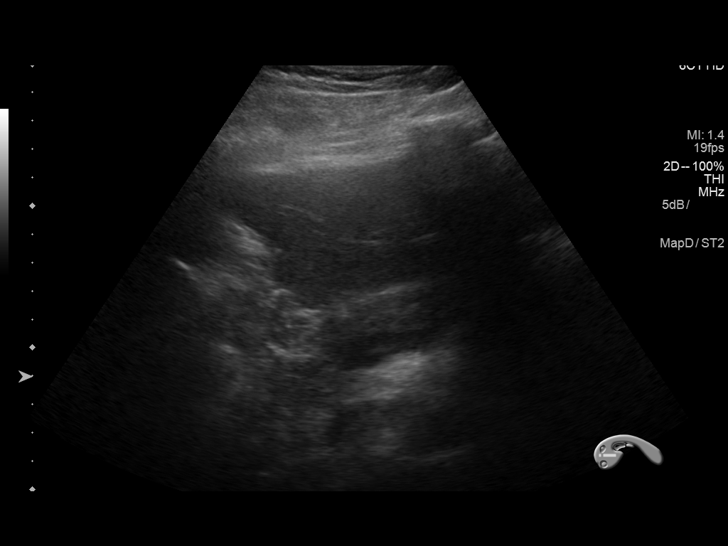
[im 44/81]
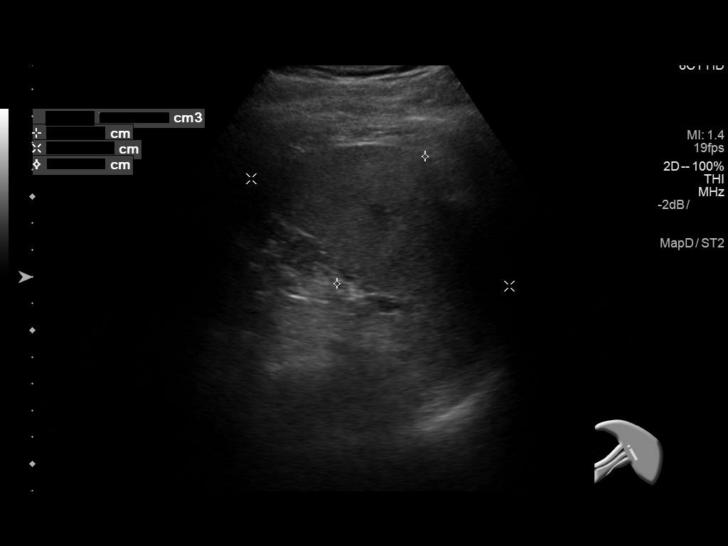
[im 51/81]
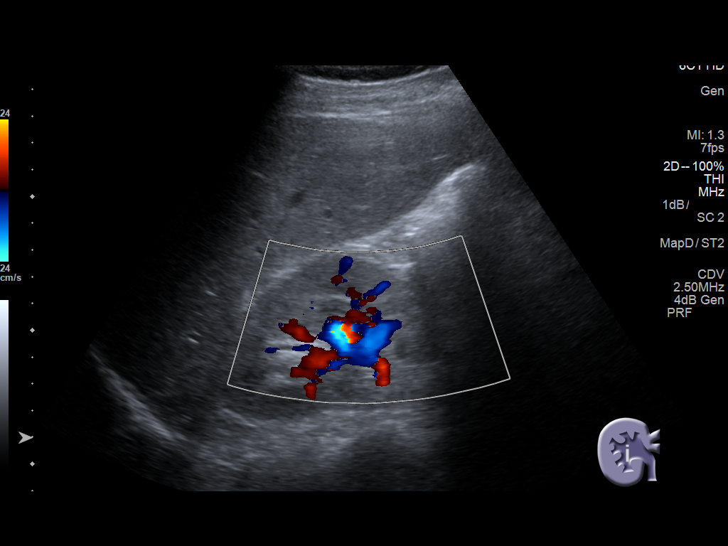
[im 54/81]
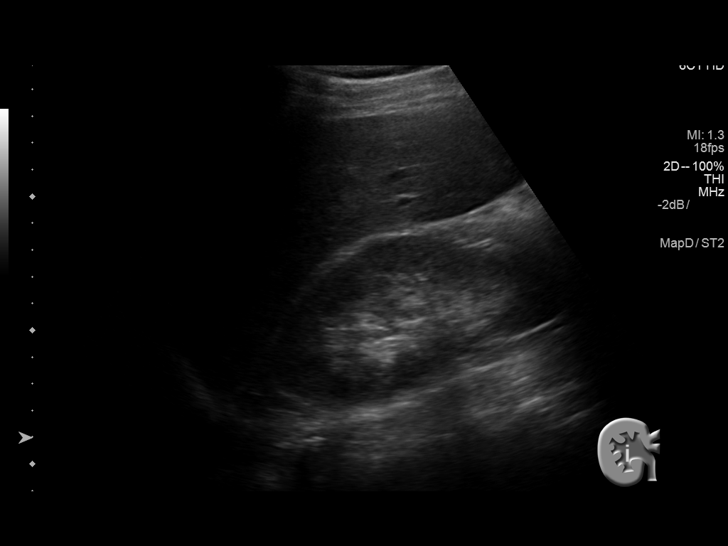
[im 61/81]
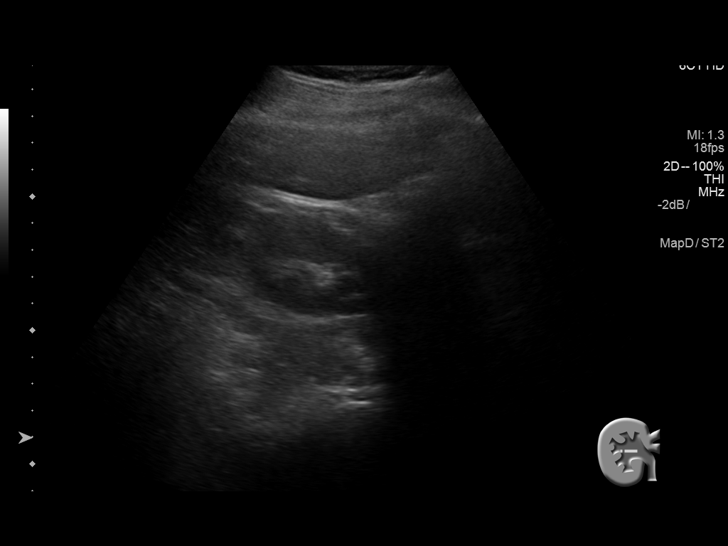
[im 67/81]
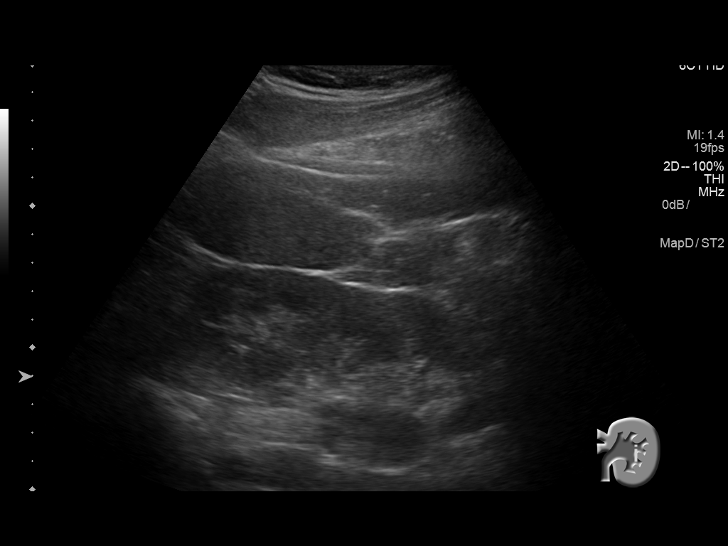
[im 74/81]
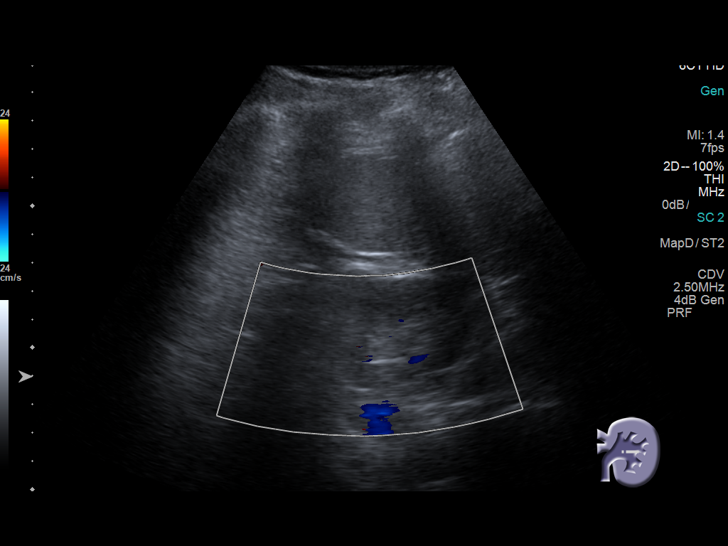
[im 81/81]
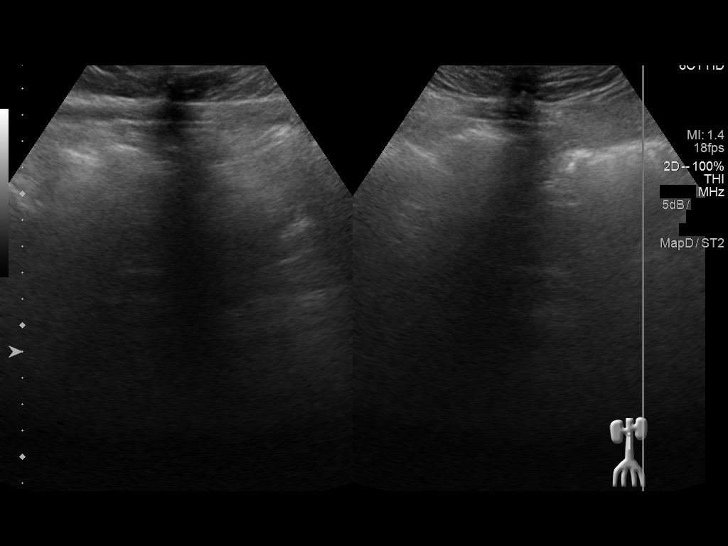

[14 of 25 positions shown; findings below may reference images not displayed]

FINDINGS: Gallbladder: No gallstones or wall thickening visualized. No
sonographic Murphy sign noted by sonographer.

Common bile duct: Diameter: 2 mm

Liver: No focal lesion identified. Within normal limits in
parenchymal echogenicity.

IVC: No abnormality visualized.

Pancreas: Visualized portion unremarkable.

Spleen: Size and appearance within normal limits.

Right Kidney: Length: 12.9 cm. Echogenicity within normal limits. No
mass or hydronephrosis visualized.

Left Kidney: Length: 12.8 cm. Echogenicity within normal limits. No
mass or hydronephrosis visualized.

Abdominal aorta: No aneurysm visualized.

Other findings: Exam limited as patient was not post prandial.
Prominent bowel gas.
IMPRESSION: Normal exam. No focal etiology noted for the patient's left upper
quadrant pain. Spleen appears normal.

## 2017-09-25 ENCOUNTER — Encounter: Payer: Self-pay | Admitting: Family Medicine

## 2017-09-25 ENCOUNTER — Ambulatory Visit: Payer: BLUE CROSS/BLUE SHIELD | Admitting: Family Medicine

## 2017-09-25 VITALS — BP 125/76 | HR 81 | Temp 97.0°F | Ht 71.0 in | Wt 184.0 lb

## 2017-09-25 DIAGNOSIS — F3342 Major depressive disorder, recurrent, in full remission: Secondary | ICD-10-CM | POA: Diagnosis not present

## 2017-09-25 DIAGNOSIS — K2100 Gastro-esophageal reflux disease with esophagitis, without bleeding: Secondary | ICD-10-CM

## 2017-09-25 DIAGNOSIS — G479 Sleep disorder, unspecified: Secondary | ICD-10-CM | POA: Diagnosis not present

## 2017-09-25 MED ORDER — PANTOPRAZOLE SODIUM 40 MG PO TBEC: 40.0000 mg | DELAYED_RELEASE_TABLET | Freq: Every day | ORAL | 5 refills | Status: DC

## 2017-09-25 MED ORDER — DULOXETINE HCL 60 MG PO CPEP: ORAL_CAPSULE | ORAL | 5 refills | Status: DC

## 2017-09-25 NOTE — Progress Notes (Signed)
Subjective:  Patient ID: Rodney Ruiz, male    DOB: Jul 12, 1981  Age: 37 y.o. MRN: [id number]  CC: Follow-up (pt here today for routine follow up and medication refills)   HPI Rodney Ruiz presents for patient tells me that he is doing quite well with his medication, tolerating it without side effects.  He is not having any recurrence of his depression.  His sleep is better as well.  Patient in for follow-up of GERD. Currently asymptomatic taking  PPI daily. There is no chest pain or heartburn. No hematemesis and no melena. No dysphagia or choking. Onset is remote. Progression is stable. Complicating factors, none.   Depression screen [facility] 2/9 09/25/2017 02/14/2017 12/12/2016  Decreased Interest 0 0 3  Down, Depressed, Hopeless 0 0 3  PHQ - 2 Score 0 0 6  Altered sleeping - - 2  Tired, decreased energy - - 3  Change in appetite - - 1  Feeling bad or failure about yourself  - - 2  Trouble concentrating - - 1  Moving slowly or fidgety/restless - - 0  Suicidal thoughts - - 0  PHQ-9 Score - - 15    History Rodney Ruiz has a past medical history of Constipation, Mass of finger of right hand (11/2015), and Panic attacks.   He has a past surgical history that includes Nailbed repair (Right, 08/20/2015); Closed reduction finger with percutaneous pinning (Right, 08/20/2015); and Mass excision (Right, 12/09/2015).   His family history includes Cancer in his mother.He reports that he has been smoking cigarettes.  He started smoking about 21 years ago. He has a 20.00 pack-year smoking history. he has never used smokeless tobacco. He reports that he does not drink alcohol or use drugs.    ROS Review of Systems  Constitutional: Negative for chills, diaphoresis, fever and unexpected weight change.  HENT: Negative for congestion, hearing loss, rhinorrhea and sore throat.   Eyes: Negative for visual disturbance.  Respiratory: Negative for cough and shortness of breath.   Cardiovascular: Negative for  chest pain.  Gastrointestinal: Negative for abdominal pain, constipation and diarrhea.  Genitourinary: Negative for dysuria and flank pain.  Musculoskeletal: Negative for arthralgias and joint swelling.  Skin: Negative for rash.  Neurological: Negative for dizziness and headaches.  Psychiatric/Behavioral: Negative for dysphoric mood and sleep disturbance.    Objective:  BP 125/76   Pulse 81   Temp (!) 97 F (36.1 C) (Oral)   Ht 5\' 11"  (1.803 m)   Wt 184 lb (83.5 kg)   BMI 25.66 kg/m   BP Readings from Last 3 Encounters:  09/25/17 125/76  07/24/17 134/73  02/14/17 130/76    Wt Readings from Last 3 Encounters:  09/25/17 184 lb (83.5 kg)  07/24/17 182 lb (82.6 kg)  02/14/17 173 lb (78.5 kg)     Physical Exam  Constitutional: He is oriented to person, place, and time. He appears well-developed and well-nourished. No distress.  HENT:  Head: Normocephalic and atraumatic.  Right Ear: External ear normal.  Left Ear: External ear normal.  Nose: Nose normal.  Mouth/Throat: Oropharynx is clear and moist.  Eyes: Conjunctivae and EOM are normal. Pupils are equal, round, and reactive to light.  Neck: Normal range of motion. Neck supple. No thyromegaly present.  Cardiovascular: Normal rate, regular rhythm and normal heart sounds.  No murmur heard. Pulmonary/Chest: Effort normal and breath sounds normal. No respiratory distress. He has no wheezes. He has no rales.  Abdominal: Soft. Bowel sounds are normal. He exhibits no distension.  There is no tenderness.  Lymphadenopathy:    He has no cervical adenopathy.  Neurological: He is alert and oriented to person, place, and time. He has normal reflexes.  Skin: Skin is warm and dry.  Psychiatric: He has a normal mood and affect. His behavior is normal. Judgment and thought content normal.      Assessment & Plan:   Rodney Ruiz was seen today for follow-up.  Diagnoses and all orders for this visit:  Sleep disturbance -     Nocturnal  polysomnography (NPSG); Future  Recurrent major depressive disorder, in full remission (Rodney Ruiz)  Gastroesophageal reflux disease with esophagitis  Other orders -     DULoxetine (CYMBALTA) 60 MG capsule; TAKE 1 CAPSULE BY MOUTH EVERY DAY -     pantoprazole (PROTONIX) 40 MG tablet; Take 1 tablet (40 mg total) by mouth daily.       I have discontinued Rodney Ruiz's ranitidine. I am also having him maintain his psyllium, DULoxetine, and pantoprazole.  Allergies as of 09/25/2017   No Known Allergies     Medication List        Accurate as of 09/25/17  6:52 PM. Always use your most recent med list.          DULoxetine 60 MG capsule Commonly known as:  CYMBALTA TAKE 1 CAPSULE BY MOUTH EVERY DAY   pantoprazole 40 MG tablet Commonly known as:  PROTONIX Take 1 tablet (40 mg total) by mouth daily.   psyllium 58.6 % packet Commonly known as:  METAMUCIL Take 1 packet by mouth 2 (two) times daily.        Follow-up: Return in about 6 months (around 03/25/2018).  Claretta Fraise, M.D.

## 2017-10-15 ENCOUNTER — Other Ambulatory Visit: Payer: Self-pay | Admitting: *Deleted

## 2017-10-15 MED ORDER — DULOXETINE HCL 60 MG PO CPEP: ORAL_CAPSULE | ORAL | 1 refills | Status: DC

## 2017-10-17 ENCOUNTER — Other Ambulatory Visit: Payer: Self-pay

## 2017-10-17 MED ORDER — DULOXETINE HCL 60 MG PO CPEP: ORAL_CAPSULE | ORAL | 0 refills | Status: DC

## 2017-10-31 ENCOUNTER — Telehealth: Payer: Self-pay

## 2017-10-31 NOTE — Telephone Encounter (Signed)
Please refer as requested Thanks, WS 

## 2017-10-31 NOTE — Telephone Encounter (Signed)
You put a sleep study order in in January that went no where  Do you want this referral re done?

## 2017-11-01 ENCOUNTER — Other Ambulatory Visit: Payer: Self-pay

## 2017-11-01 DIAGNOSIS — R0681 Apnea, not elsewhere classified: Secondary | ICD-10-CM

## 2017-11-01 NOTE — Progress Notes (Unsigned)
slee

## 2017-12-31 ENCOUNTER — Encounter: Payer: Self-pay | Admitting: Neurology

## 2017-12-31 ENCOUNTER — Ambulatory Visit: Payer: BLUE CROSS/BLUE SHIELD | Admitting: Neurology

## 2017-12-31 VITALS — BP 138/86 | HR 83 | Ht 71.0 in | Wt 182.0 lb

## 2017-12-31 DIAGNOSIS — R0683 Snoring: Secondary | ICD-10-CM

## 2017-12-31 DIAGNOSIS — R002 Palpitations: Secondary | ICD-10-CM | POA: Diagnosis not present

## 2017-12-31 DIAGNOSIS — R0689 Other abnormalities of breathing: Secondary | ICD-10-CM | POA: Diagnosis not present

## 2017-12-31 DIAGNOSIS — E663 Overweight: Secondary | ICD-10-CM

## 2017-12-31 DIAGNOSIS — G4719 Other hypersomnia: Secondary | ICD-10-CM | POA: Diagnosis not present

## 2017-12-31 DIAGNOSIS — R51 Headache: Secondary | ICD-10-CM

## 2017-12-31 DIAGNOSIS — R519 Headache, unspecified: Secondary | ICD-10-CM

## 2017-12-31 NOTE — Progress Notes (Signed)
Subjective:    Patient ID: Rodney Ruiz is a 37 y.o. male.  HPI     Star Age, MD, PhD [facility] [address], [address] [place], [place] [address]  Dear Dr. Livia Snellen,   I saw your patient, Rodney Ruiz, upon your kind request, in my neurologic clinic today for initial consultation of his sleep disorder, in particular, concern for underlying obstructive sleep apnea, he wonders if he may have central sleep apnea. The patient is unaccompanied today. As you know, [name] is a 37 year old right-handed gentleman with an underlying medical history of reflux disease, anxiety, smoking, and borderline overweight state, who reports snoring and daytime somnolence. I reviewed your office note from 09/25/2017. His Epworth sleepiness score is 12 out of 24, fatigue score is 46 out of 63. He is married and lives with his wife, they have 2 children. He smokes one and half packs per day, does not utilize alcohol, drinks caffeine in the form of soda, about 5 cans per day on average. His bedtime is between 9:30 and 10, rise time around 5. He has had gasping sensation and feeling of inability to breathe while asleep. His snoring is generally not loud as far as he knows. He has no family history of OSA as far as he recalls. He has no nighttime nocturia but has woken up with a headache at times. He has woken up with significant palpitations. He has never seen cardiology. He does not watch TV in his bedroom, they have 2 cats in the household and one daughter, sometimes Sleep in their bedroom but not on the bed typically. He has a 39 year old and a 69 year old child. His weight has been stable generally but he has gained a little bit in the recent past. He used to have asthma as a child, no current need for rescue inhaler. He works Monday through Friday, day shift.  His Past Medical History Is Significant For: Past Medical History:  Diagnosis Date  . Constipation   . Mass of  finger of right hand 11/2015   middle finger  . Panic attacks     His Past Surgical History Is Significant For: Past Surgical History:  Procedure Laterality Date  . CLOSED REDUCTION FINGER WITH PERCUTANEOUS PINNING Right 08/20/2015   Procedure: CLOSED REDUCTION WITH PERCUTANEOUS PINNING DISTAL PHALANX RIGHT LONG AND RING FINGERS;  Surgeon: Daryll Brod, MD;  Location: [facility];  Service: Orthopedics;  Laterality: Right;  . MASS EXCISION Right 12/09/2015   Procedure: EXCISION MASS RIGHT MIDDLE FINGER,PROXIMAL INTERPHALANGEAL JOINT;  Surgeon: Daryll Brod, MD;  Location: [facility];  Service: Orthopedics;  Laterality: Right;  . NAILBED REPAIR Right 08/20/2015   Procedure: NAILBED REPAIR RIGHT LONG RING AND SMALL FINGERS;  Surgeon: Daryll Brod, MD;  Location: [facility];  Service: Orthopedics;  Laterality: Right;    His Family History Is Significant For: Family History  Problem Relation Age of Onset  . Cancer Mother        breast    His Social History Is Significant For: Social History   Socioeconomic History  . Marital status: Married    Spouse name: Not on file  . Number of children: Not on file  . Years of education: Not on file  . Highest education level: Not on file  Occupational History  . Not on file  Social Needs  . Financial resource strain: Not on file  . Food insecurity:    Worry: Not on file  Inability: Not on file  . Transportation needs:    Medical: Not on file    Non-medical: Not on file  Tobacco Use  . Smoking status: Current Every Day Smoker    Packs/day: 1.00    Years: 20.00    Pack years: 20.00    Types: Cigarettes    Start date: 02/29/1996  . Smokeless tobacco: Never Used  Substance and Sexual Activity  . Alcohol use: No  . Drug use: No  . Sexual activity: Not on file  Lifestyle  . Physical activity:    Days per week: Not on file    Minutes per session: Not on file  . Stress: Not on file  Relationships   . Social connections:    Talks on phone: Not on file    Gets together: Not on file    Attends religious service: Not on file    Active member of club or organization: Not on file    Attends meetings of clubs or organizations: Not on file    Relationship status: Not on file  Other Topics Concern  . Not on file  Social History Narrative  . Not on file    His Allergies Are:  No Known Allergies:   His Current Medications Are:  Outpatient Encounter Medications as of 12/31/2017  Medication Sig  . DULoxetine (CYMBALTA) 60 MG capsule TAKE 1 CAPSULE BY MOUTH EVERY DAY  . pantoprazole (PROTONIX) 40 MG tablet Take 1 tablet (40 mg total) by mouth daily.  . psyllium (METAMUCIL) 58.6 % packet Take 1 packet by mouth 2 (two) times daily.   No facility-administered encounter medications on file as of 12/31/2017.   :  Review of Systems:  Out of a complete 14 point review of systems, all are reviewed and negative with the exception of these symptoms as listed below: Review of Systems  Neurological:       Pt presents today to discuss his sleep. Pt has never had a sleep study but does endorse occasional snoring.  Epworth Sleepiness Scale 0= would never doze 1= slight chance of dozing 2= moderate chance of dozing 3= high chance of dozing  Sitting and reading: 3 Watching TV: 3 Sitting inactive in a public place (ex. Theater or meeting): 1 As a passenger in a car for an hour without a break: 1 Lying down to rest in the afternoon: 2 Sitting and talking to someone: 0 Sitting quietly after lunch (no alcohol): 2 In a car, while stopped in traffic: 0 Total: 12     Objective:  Neurological Exam  Physical Exam Physical Examination:   Vitals:   12/31/17 0843  BP: (!) 159/79  Pulse: 83    General Examination: The patient is a very pleasant 37 y.o. male in no acute distress. He appears well-developed and well-nourished and well groomed.   HEENT: Normocephalic, atraumatic, pupils are  equal, round and reactive to light and accommodation. Funduscopic exam is normal with sharp disc margins noted. Extraocular tracking is good without limitation to gaze excursion or nystagmus noted. Normal smooth pursuit is noted. Hearing is grossly intact. Tympanic membranes are clear bilaterally. Face is symmetric with normal facial animation and normal facial sensation. Speech is clear with no dysarthria noted. There is no hypophonia. There is no lip, neck/head, jaw or voice tremor. Neck is supple with full range of passive and active motion. There are no carotid bruits on auscultation. Oropharynx exam reveals: mild mouth dryness, adequate dental hygiene and mild airway crowding, due to tonsils  are 2+ and larger/thicker uvula. He has a Mallampati class I. He has a mild overbite. Tongue protrudes centrally and palate elevates symmetrically. Neck circumference is 16-3/8 inches.  Chest: Clear to auscultation without wheezing, rhonchi or crackles noted.  Heart: S1+S2+0, regular and normal without murmurs, rubs or gallops noted.   Abdomen: Soft, non-tender and non-distended with normal bowel sounds appreciated on auscultation.  Extremities: There is no pitting edema in the distal lower extremities bilaterally. Pedal pulses are intact.  Skin: Warm and dry without trophic changes noted.  Musculoskeletal: exam reveals no obvious joint deformities, tenderness or joint swelling or erythema.   Neurologically:  Mental status: The patient is awake, alert and oriented in all 4 spheres. His immediate and remote memory, attention, language skills and fund of knowledge are appropriate. There is no evidence of aphasia, agnosia, apraxia or anomia. Speech is clear with normal prosody and enunciation. Thought process is linear. Mood is normal and affect is normal.  Cranial nerves II - XII are as described above under HEENT exam. In addition: shoulder shrug is normal with equal shoulder height noted. Motor exam: Normal  bulk, strength and tone is noted. There is no drift, tremor or rebound. Romberg is negative. Reflexes are 1-2+ throughout, Slightly less in the right knee and ankle compared to left. Fine motor skills and coordination: intact with normal finger taps, normal hand movements, normal rapid alternating patting, normal foot taps and normal foot agility.  Cerebellar testing: No dysmetria or intention tremor on finger to nose testing. Heel to shin is unremarkable bilaterally. There is no truncal or gait ataxia.  Sensory exam: intact to light touch in the upper and lower extremities.  Gait, station and balance: He stands easily. No veering to one side is noted. No leaning to one side is noted. Posture is age-appropriate and stance is narrow based. Gait shows normal stride length and normal pace. No problems turning are noted. Tandem walk is unremarkable.   Assessment and Plan:  In summary, Kerrington Greenhalgh is a very pleasant 36 y.o.-year old male with an underlying medical history of reflux disease, anxiety, smoking, and borderline overweight state, whose history and physical exam are concerning for obstructive sleep apnea (OSA). I had a long chat with the patient about my findings and the diagnosis of OSA, its prognosis and treatment options. We talked about medical treatments, surgical interventions and non-pharmacological approaches. I explained in particular the risks and ramifications of untreated moderate to severe OSA, especially with respect to developing cardiovascular disease down the Road, including congestive heart failure, difficult to treat hypertension, cardiac arrhythmias, or stroke. Even type 2 diabetes has, in part, been linked to untreated OSA. Symptoms of untreated OSA include daytime sleepiness, memory problems, mood irritability and mood disorder such as depression and anxiety, lack of energy, as well as recurrent headaches, especially morning headaches. We talked about smoking cessation and trying  to maintain a healthy lifestyle in general, as well as the importance of weight control. I encouraged the patient to eat healthy, exercise daily and keep well hydrated, to keep a scheduled bedtime and wake time routine, to not skip any meals and eat healthy snacks in between meals. I advised the patient not to drive when feeling sleepy. I recommended the following at this time: sleep study with potential positive airway pressure titration. (We will score hypopneas at 3%).   I explained the sleep test procedure to the patient and also outlined possible surgical and non-surgical treatment options of OSA, including the use  of a custom-made dental device (which would require a referral to a specialist dentist or oral surgeon), upper airway surgical options, such as pillar implants, radiofrequency surgery, tongue base surgery, and UPPP (which would involve a referral to an ENT surgeon). Rarely, jaw surgery such as mandibular advancement may be considered.  I also explained the CPAP treatment option to the patient, who indicated that he would be willing to try CPAP if the need arises. I explained the importance of being compliant with PAP treatment, not only for insurance purposes but primarily to improve His symptoms, and for the patient's long term health benefit, including to reduce His cardiovascular risks. I answered all his questions today and the patient was in agreement. I would like to see him back after the sleep study is completed and encouraged him to call with any interim questions, concerns, problems or updates.   Thank you very much for allowing me to participate in the care of this nice patient. If I can be of any further assistance to you please do not hesitate to call me at [phone].  Sincerely,   Star Age, MD, PhD

## 2017-12-31 NOTE — Patient Instructions (Addendum)

## 2018-02-01 ENCOUNTER — Ambulatory Visit (INDEPENDENT_AMBULATORY_CARE_PROVIDER_SITE_OTHER): Payer: BLUE CROSS/BLUE SHIELD | Admitting: Neurology

## 2018-02-01 DIAGNOSIS — G4719 Other hypersomnia: Secondary | ICD-10-CM

## 2018-02-01 DIAGNOSIS — R0683 Snoring: Secondary | ICD-10-CM

## 2018-02-01 DIAGNOSIS — R51 Headache: Secondary | ICD-10-CM

## 2018-02-01 DIAGNOSIS — E663 Overweight: Secondary | ICD-10-CM

## 2018-02-01 DIAGNOSIS — R002 Palpitations: Secondary | ICD-10-CM

## 2018-02-01 DIAGNOSIS — G4761 Periodic limb movement disorder: Secondary | ICD-10-CM

## 2018-02-01 DIAGNOSIS — R519 Headache, unspecified: Secondary | ICD-10-CM

## 2018-02-01 DIAGNOSIS — R0689 Other abnormalities of breathing: Secondary | ICD-10-CM

## 2018-02-06 NOTE — Procedures (Signed)
PATIENT'S NAME:  Rodney Ruiz, Rodney Ruiz DOB:      16-Apr-1981      MR#:    [id number]     DATE OF RECORDING: 02/01/2018 REFERRING M.D.:  Claretta Fraise, MD Study Performed:   Baseline Polysomnogram HISTORY: 37 year old man with a history of reflux disease, anxiety, smoking, and borderline overweight state who reports snoring and daytime somnolence. The patient endorsed the Epworth Sleepiness Scale at 12 points. The patient's weight 182 pounds with a height of 71 (inches), resulting in a BMI of 25.6 kg/m2. The patient's neck circumference measured 16.5 inches.  CURRENT MEDICATIONS: Cymbalta, Protonix, Metamucil   PROCEDURE:  This is a multichannel digital polysomnogram utilizing the Somnostar 11.2 system.  Electrodes and sensors were applied and monitored per AASM Specifications.   EEG, EOG, Chin and Limb EMG, were sampled at 200 Hz.  ECG, Snore and Nasal Pressure, Thermal Airflow, Respiratory Effort, CPAP Flow and Pressure, Oximetry was sampled at 50 Hz. Digital video and audio were recorded.      BASELINE STUDY  Lights Out was at 21:03 and Lights On at 04:51.  Total recording time (TRT) was 468 minutes, with a total sleep time (TST) of  398.5 minutes.   The patient's sleep latency was 12.5 minutes. REM latency was 251.5 minutes, which is markedly delayed. The sleep efficiency was 85.1 %.     SLEEP ARCHITECTURE: WASO (Wake after sleep onset) was 64.5 minutes with mild sleep fragmentation noted. There were 47 minutes in Stage N1, 211.5 minutes Stage N2, 63 minutes Stage N3 and 77 minutes in Stage REM.  The percentage of Stage N1 was 11.8%, which is increased, Stage N2 was 53.1%, which is normal, Stage N3 was 15.8%, which is normal, and Stage R (REM sleep) was 19.3%, which is near-normal. The arousals were noted as: 16 were spontaneous, 2 were associated with PLMs, 0 were associated with respiratory events.  RESPIRATORY ANALYSIS:  There were a total of 3 respiratory events:  0 obstructive apneas, 0 central  apneas and 0 mixed apneas with a total of 0 apneas and an apnea index (AI) of 0 /hour. There were 3 hypopneas with a hypopnea index of .5 /hour. The patient also had 0 respiratory event related arousals (RERAs).      The total APNEA/HYPOPNEA INDEX (AHI) was .5/hour and the total RESPIRATORY DISTURBANCE INDEX was 0. .5 /hour.  3 events occurred in REM sleep and 0 events in NREM. The REM AHI was 2.3 /hour, versus a non-REM AHI of 0. The patient spent 226.5 minutes of total sleep time in the supine position and 172 minutes in non-supine. The supine AHI was 0 versus a non-supine AHI of 1.0.  OXYGEN SATURATION & C02:  The Wake baseline 02 saturation was 96%, with the lowest being 87%. Time spent below 89% saturation equaled 4 minutes.  PERIODIC LIMB MOVEMENTS:   The patient had a total of 125 Periodic Limb Movements.  The Periodic Limb Movement (PLM) index was 18.8 and the PLM Arousal index was .3/hour.  Audio and video analysis did not show any abnormal or unusual movements, behaviors, phonations or vocalizations. The patient took 1 bathroom break. Minimal snoring was noted. The EKG was in keeping with normal sinus rhythm (NSR).   Post-study, the patient indicated that sleep was worse than usual.   IMPRESSION:  1. Periodic Limb Movement Disorder (PLMD)  RECOMMENDATIONS:  1. This study does not demonstrate any significant obstructive or central sleep disordered breathing. This study does not support an intrinsic sleep disorder  as a cause of the patient's symptoms. Other causes, including circadian rhythm disturbances, an underlying mood disorder, medication effect and/or an underlying medical problem cannot be ruled out. 2. Mild PLMs (periodic limb movements of sleep) were noted during this study with no significant arousals; clinical correlation is recommended. Medication effect from the antidepressant medication should be considered.  3. The patient should be cautioned not to drive, work at  heights, or operate dangerous or heavy equipment when tired or sleepy. Review and reiteration of good sleep hygiene measures should be pursued with any patient. 4. The patient will be advised to follow up with the referring provider, who will be notified of the test results.  I certify that I have reviewed the entire raw data recording prior to the issuance of this report in accordance with the Standards of Accreditation of the American Academy of Sleep Medicine (AASM)    Star Age, MD, PhD Diplomat, American Board of Psychiatry and Neurology (Neurology and Sleep Medicine)

## 2018-02-06 NOTE — Progress Notes (Signed)
Patient referred by Dr. Livia Snellen, seen by me on 12/31/17, diagnostic PSG on 02/01/18.   Please call and notify the patient that the recent sleep study did not show any significant obstructive sleep apnea, overall benign findings, achieved all stages of sleep. Mild leg movements noted, could be a medication effect, no sleep disruption from leg movements noted. He can FU with PCP; Please remind patient to try to maintain good sleep hygiene, which means: Keep a regular sleep and wake schedule and make enough time for sleep (7 1/2 to 8 1/2 hours for the average adult), try not to exercise or have a meal within 2 hours of your bedtime, try to keep your bedroom conducive for sleep, that is, cool and dark, without light distractors such as an illuminated alarm clock, and refrain from watching TV right before sleep or in the middle of the night and do not keep the TV or radio on during the night. If a nightlight is used, have it away from the visual field. Also, try not to use or play on electronic devices at bedtime, such as your cell phone, tablet PC or laptop. If you like to read at bedtime on an electronic device, try to dim the background light as much as possible. Do not eat in the middle of the night. Keep pets away from the bedroom environment. For stress relief, try meditation, deep breathing exercises (there are many books and CDs available), a white noise machine or fan can help to diffuse other noise distractors, such as traffic noise. Do not drink alcohol before bedtime, as it can disturb sleep and cause middle of the night awakenings. Never mix alcohol and sedating medications! Avoid narcotic pain medication close to bedtime, as opioids/narcotics can suppress breathing drive and breathing effort.    Thanks,  Star Age, MD, PhD [facility])

## 2018-02-07 ENCOUNTER — Telehealth: Payer: Self-pay

## 2018-02-07 NOTE — Telephone Encounter (Signed)
-----   Message from Star Age, MD sent at 02/06/2018 10:58 AM EDT ----- Patient referred by Dr. Livia Snellen, seen by me on 12/31/17, diagnostic PSG on 02/01/18.   Please call and notify the patient that the recent sleep study did not show any significant obstructive sleep apnea, overall benign findings, achieved all stages of sleep. Mild leg movements noted, could be a medication effect, no sleep disruption from leg movements noted. He can FU with PCP; Please remind patient to try to maintain good sleep hygiene, which means: Keep a regular sleep and wake schedule and make enough time for sleep (7 1/2 to 8 1/2 hours for the average adult), try not to exercise or have a meal within 2 hours of your bedtime, try to keep your bedroom conducive for sleep, that is, cool and dark, without light distractors such as an illuminated alarm clock, and refrain from watching TV right before sleep or in the middle of the night and do not keep the TV or radio on during the night. If a nightlight is used, have it away from the visual field. Also, try not to use or play on electronic devices at bedtime, such as your cell phone, tablet PC or laptop. If you like to read at bedtime on an electronic device, try to dim the background light as much as possible. Do not eat in the middle of the night. Keep pets away from the bedroom environment. For stress relief, try meditation, deep breathing exercises (there are many books and CDs available), a white noise machine or fan can help to diffuse other noise distractors, such as traffic noise. Do not drink alcohol before bedtime, as it can disturb sleep and cause middle of the night awakenings. Never mix alcohol and sedating medications! Avoid narcotic pain medication close to bedtime, as opioids/narcotics can suppress breathing drive and breathing effort.    Thanks,  Star Age, MD, PhD [facility])

## 2018-02-07 NOTE — Telephone Encounter (Signed)
I called pt to discuss his sleep study results. No answer, left a message asking him to call me back. 

## 2018-02-07 NOTE — Telephone Encounter (Signed)
I called pt again to discuss. No answer, left a message asking him to call me back. 

## 2018-02-08 NOTE — Telephone Encounter (Signed)
Pt returned RN's call °

## 2018-02-08 NOTE — Telephone Encounter (Signed)
Called and left detailed message (ok per DPR) about sleep study results. Relayed Dr. Tori Milks message. Gave [facility] phone number if he has further questions/concerns.

## 2018-03-26 ENCOUNTER — Ambulatory Visit: Payer: BLUE CROSS/BLUE SHIELD | Admitting: Family Medicine

## 2018-06-11 ENCOUNTER — Other Ambulatory Visit: Payer: Self-pay | Admitting: Family Medicine

## 2018-06-11 NOTE — Telephone Encounter (Signed)
Authorize 30 days only. Then contact the patient letting them know that they will need an appointment before any further prescriptions can be sent in. 

## 2018-06-11 NOTE — Telephone Encounter (Signed)
Last seen 08/05/18 

## 2018-06-11 NOTE — Telephone Encounter (Signed)
Left detailed message.   

## 2018-06-25 ENCOUNTER — Other Ambulatory Visit: Payer: Self-pay | Admitting: Family Medicine

## 2018-06-25 NOTE — Telephone Encounter (Signed)
Last seen 08/05/18 

## 2018-07-26 ENCOUNTER — Other Ambulatory Visit: Payer: Self-pay | Admitting: Family Medicine

## 2018-08-22 ENCOUNTER — Other Ambulatory Visit: Payer: Self-pay | Admitting: Family Medicine

## 2018-08-22 NOTE — Telephone Encounter (Signed)
Last seen 08/05/18 

## 2018-09-13 ENCOUNTER — Other Ambulatory Visit: Payer: Self-pay | Admitting: Family Medicine

## 2018-09-24 ENCOUNTER — Other Ambulatory Visit: Payer: Self-pay | Admitting: Family Medicine

## 2018-09-24 NOTE — Telephone Encounter (Signed)
Left message to please call our office and schedule for refills.

## 2018-09-24 NOTE — Telephone Encounter (Signed)
Stacks. NTBS 30 days given 08/28/18

## 2018-10-02 ENCOUNTER — Encounter: Payer: Self-pay | Admitting: Family Medicine

## 2018-10-02 ENCOUNTER — Ambulatory Visit: Payer: BLUE CROSS/BLUE SHIELD | Admitting: Family Medicine

## 2018-10-02 VITALS — BP 130/79 | HR 73 | Temp 97.1°F | Ht 71.0 in | Wt 190.2 lb

## 2018-10-02 DIAGNOSIS — F3342 Major depressive disorder, recurrent, in full remission: Secondary | ICD-10-CM | POA: Diagnosis not present

## 2018-10-02 DIAGNOSIS — K2100 Gastro-esophageal reflux disease with esophagitis, without bleeding: Secondary | ICD-10-CM

## 2018-10-02 MED ORDER — PANTOPRAZOLE SODIUM 40 MG PO TBEC
40.0000 mg | DELAYED_RELEASE_TABLET | Freq: Every day | ORAL | 1 refills | Status: DC
Start: 2018-10-02 — End: 2019-04-17

## 2018-10-02 MED ORDER — DESVENLAFAXINE SUCCINATE ER 100 MG PO TB24: 100.0000 mg | ORAL_TABLET | Freq: Every day | ORAL | 2 refills | Status: DC

## 2018-10-07 ENCOUNTER — Encounter: Payer: Self-pay | Admitting: Family Medicine

## 2018-10-07 NOTE — Progress Notes (Signed)
Subjective:  Patient ID: Rodney Ruiz, male    DOB: 13-Jan-1981  Age: 38 y.o. MRN: [id number]  CC: Medical Management of Chronic Issues   HPI Rodney Ruiz presents for patient says depression is doing well as long as he takes the Cymbalta.  However it is causing some side effects primarily interrupting sleep.  Also it is causing him to have some sweats and vivid dreams.  He would like to make a switch if possible.  PHQ score score below shows that he is in complete remission from his depression.  However it has been chronic and he has need for ongoing treatment with antidepressants.  Patient in for follow-up of GERD. Currently asymptomatic taking  PPI daily. There is no chest pain or heartburn. No hematemesis and no melena. No dysphagia or choking. Onset is remote. Progression is stable. Complicating factors, none.   Depression screen Rodney Ruiz 2/9 10/02/2018 09/25/2017 02/14/2017  Decreased Interest 0 0 0  Down, Depressed, Hopeless 0 0 0  PHQ - 2 Score 0 0 0  Altered sleeping - - -  Tired, decreased energy - - -  Change in appetite - - -  Feeling bad or failure about yourself  - - -  Trouble concentrating - - -  Moving slowly or fidgety/restless - - -  Suicidal thoughts - - -  PHQ-9 Score - - -    History Rodney Ruiz has a past medical history of Constipation, Mass of finger of right hand (11/2015), and Panic attacks.   He has a past surgical history that includes Nailbed repair (Right, 08/20/2015); Closed reduction finger with percutaneous pinning (Right, 08/20/2015); and Mass excision (Right, 12/09/2015).   His family history includes Cancer in his mother.He reports that he has been smoking cigarettes. He started smoking about 22 years ago. He has a 20.00 pack-year smoking history. He has never used smokeless tobacco. He reports that he does not drink alcohol or use drugs.    ROS Review of Systems  Constitutional: Negative for fever.  Respiratory: Negative for shortness of breath.     Cardiovascular: Negative for chest pain.  Musculoskeletal: Negative for arthralgias.  Skin: Negative for rash.    Objective:  BP 130/79   Pulse 73   Temp (!) 97.1 F (36.2 C) (Oral)   Ht 5\' 11"  (1.803 m)   Wt 190 lb 3.2 oz (86.3 kg)   BMI 26.53 kg/m   BP Readings from Last 3 Encounters:  10/02/18 130/79  12/31/17 138/86  09/25/17 125/76    Wt Readings from Last 3 Encounters:  10/02/18 190 lb 3.2 oz (86.3 kg)  12/31/17 182 lb (82.6 kg)  09/25/17 184 lb (83.5 kg)     Physical Exam Vitals signs reviewed.  Constitutional:      Appearance: He is well-developed.  HENT:     Head: Normocephalic and atraumatic.     Right Ear: External ear normal.     Left Ear: External ear normal.     Mouth/Throat:     Pharynx: No oropharyngeal exudate or posterior oropharyngeal erythema.  Eyes:     Pupils: Pupils are equal, round, and reactive to light.  Neck:     Musculoskeletal: Normal range of motion and neck supple.  Cardiovascular:     Rate and Rhythm: Normal rate and regular rhythm.     Heart sounds: No murmur.  Pulmonary:     Effort: No respiratory distress.     Breath sounds: Normal breath sounds.  Neurological:     Mental Status:  He is alert and oriented to person, place, and time.  Psychiatric:        Mood and Affect: Mood normal.        Behavior: Behavior normal.        Thought Content: Thought content normal.       Assessment & Plan:   Rodney Ruiz was seen today for medical management of chronic issues.  Diagnoses and all orders for this visit:  Gastroesophageal reflux disease with esophagitis  Recurrent major depressive disorder, in full remission ([place])  Other orders -     pantoprazole (PROTONIX) 40 MG tablet; Take 1 tablet (40 mg total) by mouth daily. -     desvenlafaxine (PRISTIQ) 100 MG 24 hr tablet; Take 1 tablet (100 mg total) by mouth daily.       I have discontinued Rodney Ruiz's DULoxetine. I have also changed his pantoprazole. Additionally, I  am having him start on desvenlafaxine. Lastly, I am having him maintain his psyllium.  Allergies as of 10/02/2018   No Known Allergies     Medication List       Accurate as of October 02, 2018 11:59 PM. Always use your most recent med list.        desvenlafaxine 100 MG 24 hr tablet Commonly known as:  PRISTIQ Take 1 tablet (100 mg total) by mouth daily.   pantoprazole 40 MG tablet Commonly known as:  PROTONIX Take 1 tablet (40 mg total) by mouth daily.   psyllium 58.6 % packet Commonly known as:  METAMUCIL Take 1 packet by mouth 2 (two) times daily.        Follow-up: Return in about 6 months (around 04/02/2019) for Wellness.  Claretta Fraise, M.D.

## 2018-12-02 ENCOUNTER — Encounter: Payer: Self-pay | Admitting: Family Medicine

## 2018-12-02 ENCOUNTER — Ambulatory Visit (INDEPENDENT_AMBULATORY_CARE_PROVIDER_SITE_OTHER): Payer: BLUE CROSS/BLUE SHIELD | Admitting: Family Medicine

## 2018-12-02 ENCOUNTER — Other Ambulatory Visit: Payer: Self-pay

## 2018-12-02 DIAGNOSIS — K2100 Gastro-esophageal reflux disease with esophagitis, without bleeding: Secondary | ICD-10-CM

## 2018-12-02 DIAGNOSIS — F3342 Major depressive disorder, recurrent, in full remission: Secondary | ICD-10-CM

## 2018-12-02 DIAGNOSIS — F418 Other specified anxiety disorders: Secondary | ICD-10-CM | POA: Diagnosis not present

## 2018-12-02 NOTE — Progress Notes (Signed)
Subjective:  Patient ID: Rodney Ruiz, male    DOB: 1981-01-28  Age: 38 y.o. MRN: [id number]  CC: No chief complaint on file.   HPI Rodney Ruiz presents for depression based on change of medication. Now having trouble sleeping. Heart pounding. Dreaming of dyspnea and waking up feeling dyspneic. Feels weird in his head. A jolting sensation when he turns his head. Denies anxiety. Used sertraline in the past.   Depression screen [facility] 2/9 10/02/2018 09/25/2017 02/14/2017  Decreased Interest 0 0 0  Down, Depressed, Hopeless 0 0 0  PHQ - 2 Score 0 0 0  Altered sleeping - - -  Tired, decreased energy - - -  Change in appetite - - -  Feeling bad or failure about yourself  - - -  Trouble concentrating - - -  Moving slowly or fidgety/restless - - -  Suicidal thoughts - - -  PHQ-9 Score - - -    History Rodney Ruiz has a past medical history of Constipation, Mass of finger of right hand (11/2015), and Panic attacks.   He has a past surgical history that includes Nailbed repair (Right, 08/20/2015); Closed reduction finger with percutaneous pinning (Right, 08/20/2015); and Mass excision (Right, 12/09/2015).   His family history includes Cancer in his mother.He reports that he has been smoking cigarettes. He started smoking about 22 years ago. He has a 20.00 pack-year smoking history. He has never used smokeless tobacco. He reports that he does not drink alcohol or use drugs.    ROS Review of Systems  Constitutional: Negative for fever.  Respiratory: Negative for shortness of breath.   Cardiovascular: Negative for chest pain.  Gastrointestinal: Negative for abdominal pain and nausea.  Musculoskeletal: Negative for arthralgias.  Skin: Negative for rash.  Neurological: Positive for dizziness.  Psychiatric/Behavioral: Positive for sleep disturbance. Negative for dysphoric mood. The patient is not nervous/anxious.     Objective:  There were no vitals taken for this visit.  BP Readings from Last  3 Encounters:  10/02/18 130/79  12/31/17 138/86  09/25/17 125/76    Wt Readings from Last 3 Encounters:  10/02/18 190 lb 3.2 oz (86.3 kg)  12/31/17 182 lb (82.6 kg)  09/25/17 184 lb (83.5 kg)     Physical Exam Constitutional:      General: He is not in acute distress. Neurological:     Mental Status: He is alert and oriented to person, place, and time.  Psychiatric:        Mood and Affect: Mood normal.        Thought Content: Thought content normal.     Phone visit  Assessment & Plan:   Diagnoses and all orders for this visit:  Recurrent major depressive disorder, in full remission ([place])  Depression with anxiety  Gastroesophageal reflux disease with esophagitis     I have discontinued Rodney Ruiz's desvenlafaxine. I am also having him maintain his psyllium and pantoprazole.  Allergies as of 12/02/2018   No Known Allergies     Medication List       Accurate as of December 02, 2018 11:13 AM. Always use your most recent med list.        pantoprazole 40 MG tablet Commonly known as:  PROTONIX Take 1 tablet (40 mg total) by mouth daily.   psyllium 58.6 % packet Commonly known as:  METAMUCIL Take 1 packet by mouth 2 (two) times daily.      Virtual Visit via telephone Note  I discussed the limitations, risks, security  and privacy concerns of performing an evaluation and management service by telephone and the availability of in person appointments. I also discussed with the patient that there may be a patient responsible charge related to this service. The patient expressed understanding and agreed to proceed.  Follow Up Instructions:   I discussed the assessment and treatment plan with the patient. The patient was provided an opportunity to ask questions and all were answered. The patient agreed with the plan and demonstrated an understanding of the instructions.   The patient was advised to call back or seek an in-person evaluation if the symptoms worsen  or if the condition fails to improve as anticipated.  Call started: 11:01 Call ended:  11:11 Call minutes: 9:04   Follow-up: Return in about 6 months (around 06/04/2019), or if symptoms worsen or fail to improve.  Claretta Fraise, M.D.

## 2018-12-04 ENCOUNTER — Telehealth: Payer: Self-pay | Admitting: Family Medicine

## 2018-12-04 ENCOUNTER — Other Ambulatory Visit: Payer: Self-pay | Admitting: Family Medicine

## 2018-12-04 MED ORDER — SERTRALINE HCL 100 MG PO TABS: 100.0000 mg | ORAL_TABLET | Freq: Every day | ORAL | 2 refills | Status: DC

## 2018-12-04 NOTE — Telephone Encounter (Signed)
Pt aware by vm. 

## 2018-12-04 NOTE — Telephone Encounter (Signed)
I sent in the requested prescription 

## 2018-12-28 ENCOUNTER — Other Ambulatory Visit: Payer: Self-pay | Admitting: Family Medicine

## 2019-02-24 ENCOUNTER — Other Ambulatory Visit: Payer: Self-pay | Admitting: Family Medicine

## 2019-02-24 NOTE — Telephone Encounter (Signed)
OV 12/02/18 rtc 6 mos

## 2019-03-12 ENCOUNTER — Other Ambulatory Visit: Payer: Self-pay

## 2019-03-13 ENCOUNTER — Ambulatory Visit: Payer: BC Managed Care – PPO | Admitting: Family Medicine

## 2019-03-13 ENCOUNTER — Encounter: Payer: Self-pay | Admitting: Family Medicine

## 2019-03-13 VITALS — BP 126/82 | HR 64 | Temp 98.7°F | Ht 71.0 in | Wt 194.0 lb

## 2019-03-13 DIAGNOSIS — M542 Cervicalgia: Secondary | ICD-10-CM | POA: Diagnosis not present

## 2019-03-13 DIAGNOSIS — J0100 Acute maxillary sinusitis, unspecified: Secondary | ICD-10-CM | POA: Diagnosis not present

## 2019-03-13 DIAGNOSIS — F3341 Major depressive disorder, recurrent, in partial remission: Secondary | ICD-10-CM | POA: Diagnosis not present

## 2019-03-13 MED ORDER — AMOXICILLIN-POT CLAVULANATE 875-125 MG PO TABS: 1.0000 | ORAL_TABLET | Freq: Two times a day (BID) | ORAL | 0 refills | Status: DC

## 2019-03-13 MED ORDER — SERTRALINE HCL 100 MG PO TABS: 200.0000 mg | ORAL_TABLET | Freq: Every day | ORAL | 1 refills | Status: DC

## 2019-03-13 MED ORDER — PREDNISONE 10 MG PO TABS: ORAL_TABLET | ORAL | 0 refills | Status: DC

## 2019-03-13 MED ORDER — VARENICLINE TARTRATE 1 MG PO TABS: 1.0000 mg | ORAL_TABLET | Freq: Two times a day (BID) | ORAL | 5 refills | Status: DC

## 2019-03-13 MED ORDER — CHANTIX STARTING MONTH PAK 0.5 MG X 11 & 1 MG X 42 PO TABS: ORAL_TABLET | ORAL | 0 refills | Status: DC

## 2019-03-13 NOTE — Progress Notes (Signed)
Subjective:  Patient ID: Rodney Ruiz, male    DOB: 11-20-1980  Age: 38 y.o. MRN: [id number]  CC: Neck Pain (3-4 weeks)   HPI Rodney Ruiz presents for patient in saying that he is having a lot of sinus congestion and this congestion is leading him to have a jerking sensation in his neck that is interfering with sleep it is worsening by the day.  He feels that his smoking is likely causing his sinus congestion and would like to try Chantix for that.  The pain from the neck is moderately severe.  It is intermittent.  It is interfering with sleep.  He does not have any purulent discharge at this time but does feel pressure in his cheeks and upper teeth.    Depression screen [facility] 2/9 03/13/2019 10/02/2018 09/25/2017  Decreased Interest 0 0 0  Down, Depressed, Hopeless 0 0 0  PHQ - 2 Score 0 0 0  Altered sleeping - - -  Tired, decreased energy - - -  Change in appetite - - -  Feeling bad or failure about yourself  - - -  Trouble concentrating - - -  Moving slowly or fidgety/restless - - -  Suicidal thoughts - - -  PHQ-9 Score - - -    History Rodney Ruiz has a past medical history of Constipation, Mass of finger of right hand (11/2015), and Panic attacks.   He has a past surgical history that includes Nailbed repair (Right, 08/20/2015); Closed reduction finger with percutaneous pinning (Right, 08/20/2015); and Mass excision (Right, 12/09/2015).   His family history includes Cancer in his mother.He reports that he has been smoking cigarettes. He started smoking about 23 years ago. He has a 20.00 pack-year smoking history. He has never used smokeless tobacco. He reports that he does not drink alcohol or use drugs.    ROS Review of Systems  Constitutional: Negative.   HENT: Positive for congestion, ear pain, postnasal drip, sinus pressure and sinus pain.   Respiratory: Negative for cough and shortness of breath.   Cardiovascular: Negative for chest pain and leg swelling.  Gastrointestinal:  Negative for abdominal pain, diarrhea, nausea and vomiting.  Genitourinary: Negative for difficulty urinating.  Musculoskeletal: Positive for arthralgias and neck pain. Negative for myalgias.  Skin: Negative for rash.  Neurological: Negative for headaches.  Psychiatric/Behavioral: Positive for sleep disturbance. The patient is nervous/anxious (this has been increassing recently. Requests increased dose of sertraline  ).     Objective:  BP 126/82   Pulse 64   Temp 98.7 F (37.1 C) (Oral)   Ht 5\' 11"  (1.803 m)   Wt 194 lb (88 kg)   BMI 27.06 kg/m   BP Readings from Last 3 Encounters:  03/13/19 126/82  10/02/18 130/79  12/31/17 138/86    Wt Readings from Last 3 Encounters:  03/13/19 194 lb (88 kg)  10/02/18 190 lb 3.2 oz (86.3 kg)  12/31/17 182 lb (82.6 kg)     Physical Exam Constitutional:      Appearance: He is well-developed.  HENT:     Head: Normocephalic and atraumatic.     Right Ear: Tympanic membrane and external ear normal. No decreased hearing noted.     Left Ear: Tympanic membrane and external ear normal. No decreased hearing noted.     Nose: Mucosal edema present.     Right Sinus: Maxillary sinus tenderness present. No frontal sinus tenderness.     Left Sinus: Maxillary sinus tenderness present. No frontal sinus tenderness.  Mouth/Throat:     Pharynx: No oropharyngeal exudate or posterior oropharyngeal erythema.  Neck:     Meningeal: Brudzinski's sign absent.  Pulmonary:     Effort: No respiratory distress.     Breath sounds: Normal breath sounds.  Musculoskeletal:        General: Tenderness (for palpation of the cervical spinalis with spasm noted.) present.  Lymphadenopathy:     Head:     Right side of head: No preauricular adenopathy.     Left side of head: No preauricular adenopathy.     Cervical:     Right cervical: No superficial cervical adenopathy.    Left cervical: No superficial cervical adenopathy.       Assessment & Plan:   Rodney Ruiz  was seen today for neck pain.  Diagnoses and all orders for this visit:  Acute maxillary sinusitis, recurrence not specified  Cervicalgia  Recurrent major depressive disorder, in partial remission ([place])  Other orders -     amoxicillin-clavulanate (AUGMENTIN) 875-125 MG tablet; Take 1 tablet by mouth 2 (two) times daily. Take all of this medication -     predniSONE (DELTASONE) 10 MG tablet; Take 5 daily for 2 days followed by 4,3,2 and 1 for 2 days each. -     varenicline (CHANTIX STARTING MONTH PAK) 0.5 MG X 11 & 1 MG X 42 tablet; Use according to package directions -     varenicline (CHANTIX CONTINUING MONTH PAK) 1 MG tablet; Take 1 tablet (1 mg total) by mouth 2 (two) times daily. -     sertraline (ZOLOFT) 100 MG tablet; Take 2 tablets (200 mg total) by mouth at bedtime. For anxiety and depression       I have changed Rodney Ruiz's sertraline. I am also having him start on amoxicillin-clavulanate, predniSONE, Chantix Starting Month Pak, and varenicline. Additionally, I am having him maintain his psyllium and pantoprazole.  Allergies as of 03/13/2019   No Known Allergies     Medication List       Accurate as of March 13, 2019 11:59 PM. If you have any questions, ask your nurse or doctor.        amoxicillin-clavulanate 875-125 MG tablet Commonly known as: AUGMENTIN Take 1 tablet by mouth 2 (two) times daily. Take all of this medication Started by: Rodney Fraise, MD   Chantix Starting Month Pak 0.5 MG X 11 & 1 MG X 42 tablet Generic drug: varenicline Use according to package directions Started by: Rodney Fraise, MD   varenicline 1 MG tablet Commonly known as: Chantix Continuing Month Pak Take 1 tablet (1 mg total) by mouth 2 (two) times daily. Started by: Rodney Fraise, MD   pantoprazole 40 MG tablet Commonly known as: PROTONIX Take 1 tablet (40 mg total) by mouth daily.   predniSONE 10 MG tablet Commonly known as: DELTASONE Take 5 daily for 2 days followed by  4,3,2 and 1 for 2 days each. Started by: Rodney Fraise, MD   psyllium 58.6 % packet Commonly known as: METAMUCIL Take 1 packet by mouth 2 (two) times daily.   sertraline 100 MG tablet Commonly known as: ZOLOFT Take 2 tablets (200 mg total) by mouth at bedtime. For anxiety and depression What changed: how much to take Changed by: Rodney Fraise, MD        Follow-up: Return in about 6 months (around 09/13/2019).  Rodney Ruiz, M.D.

## 2019-03-21 ENCOUNTER — Encounter: Payer: Self-pay | Admitting: Family Medicine

## 2019-04-17 ENCOUNTER — Other Ambulatory Visit: Payer: Self-pay | Admitting: Family Medicine

## 2019-05-27 ENCOUNTER — Other Ambulatory Visit: Payer: Self-pay | Admitting: Family Medicine

## 2019-09-15 ENCOUNTER — Ambulatory Visit (INDEPENDENT_AMBULATORY_CARE_PROVIDER_SITE_OTHER): Payer: BC Managed Care – PPO | Admitting: Family Medicine

## 2019-09-15 ENCOUNTER — Encounter: Payer: Self-pay | Admitting: Family Medicine

## 2019-09-15 DIAGNOSIS — R002 Palpitations: Secondary | ICD-10-CM | POA: Diagnosis not present

## 2019-09-15 DIAGNOSIS — F3342 Major depressive disorder, recurrent, in full remission: Secondary | ICD-10-CM | POA: Diagnosis not present

## 2019-09-15 DIAGNOSIS — K2100 Gastro-esophageal reflux disease with esophagitis, without bleeding: Secondary | ICD-10-CM

## 2019-09-15 MED ORDER — PANTOPRAZOLE SODIUM 40 MG PO TBEC: 40.0000 mg | DELAYED_RELEASE_TABLET | Freq: Every day | ORAL | 1 refills | Status: DC

## 2019-09-15 MED ORDER — SERTRALINE HCL 100 MG PO TABS: 100.0000 mg | ORAL_TABLET | Freq: Every day | ORAL | 1 refills | Status: DC

## 2019-09-15 NOTE — Progress Notes (Signed)
Subjective:    Patient ID: Rodney Ruiz, male    DOB: March 16, 1981, 39 y.o.   MRN: [id number]   HPI: Rodney Ruiz is a 39 y.o. male presenting for follow up of depression, brief review of PHQ reveals score is zero now. Continues on medications. Tolerating them well. He did cut back on the zoloft because it didn't relieve the palpitations. They are still occurring at night and awakening him/. There is no chest pain or dyspnea associated.  .Patient in for follow-up of GERD. Currently asymptomatic taking  PPI daily. There is no chest pain or heartburn. No hematemesis and no melena. No dysphagia or choking. Onset is remote. Progression is stable. Complicating factors, none.   Depression screen [facility] 2/9 03/13/2019 10/02/2018 09/25/2017 02/14/2017 12/12/2016  Decreased Interest 0 0 0 0 3  Down, Depressed, Hopeless 0 0 0 0 3  PHQ - 2 Score 0 0 0 0 6  Altered sleeping - - - - 2  Tired, decreased energy - - - - 3  Change in appetite - - - - 1  Feeling bad or failure about yourself  - - - - 2  Trouble concentrating - - - - 1  Moving slowly or fidgety/restless - - - - 0  Suicidal thoughts - - - - 0  PHQ-9 Score - - - - 15     Relevant past medical, surgical, family and social history reviewed and updated as indicated.  Interim medical history since our last visit reviewed. Allergies and medications reviewed and updated.  ROS:  Review of Systems   Social History   Tobacco Use  Smoking Status Current Every Day Smoker  . Packs/day: 1.00  . Years: 20.00  . Pack years: 20.00  . Types: Cigarettes  . Start date: 02/29/1996  Smokeless Tobacco Never Used       Objective:     Wt Readings from Last 3 Encounters:  03/13/19 194 lb (88 kg)  10/02/18 190 lb 3.2 oz (86.3 kg)  12/31/17 182 lb (82.6 kg)     Exam deferred. Pt. Harboring due to COVID 19. Phone visit performed.   Assessment & Plan:   1. Palpitations   2. Gastroesophageal reflux disease with esophagitis without hemorrhage     3. Recurrent major depressive disorder, in full remission ([place])     Meds ordered this encounter  Medications  . pantoprazole (PROTONIX) 40 MG tablet    Sig: Take 1 tablet (40 mg total) by mouth daily.    Dispense:  90 tablet    Refill:  1  . sertraline (ZOLOFT) 100 MG tablet    Sig: Take 1 tablet (100 mg total) by mouth at bedtime. For anxiety and depression    Dispense:  90 tablet    Refill:  1    Orders Placed This Encounter  Procedures  . Cardiology    Referral Priority:   Routine    Referral Type:   Consultation    Referral Reason:   Specialty Services Required    Requested Specialty:   Cardiology    Number of Visits Requested:   1      Diagnoses and all orders for this visit:  Palpitations -     Cardiology  Gastroesophageal reflux disease with esophagitis without hemorrhage  Recurrent major depressive disorder, in full remission ([place])  Other orders -     pantoprazole (PROTONIX) 40 MG tablet; Take 1 tablet (40 mg total) by mouth daily. -     sertraline (ZOLOFT) 100  MG tablet; Take 1 tablet (100 mg total) by mouth at bedtime. For anxiety and depression   He had asleep study previously in May 2019. Review shows no apnea that could explain his palpitations. His adequate treatment of depression and anxiety is not consistent with the palpitations either. Thus palpitations need Cardiology work up for arrhythmia and ischemia.    Virtual Visit via telephone Note  I discussed the limitations, risks, security and privacy concerns of performing an evaluation and management service by telephone and the availability of in person appointments. The patient was identified with two identifiers. Pt.expressed understanding and agreed to proceed. Pt. Is at home. Dr. Livia Snellen is in his office.  Follow Up Instructions:   I discussed the assessment and treatment plan with the patient. The patient was provided an opportunity to ask questions and all were answered. The patient agreed with  the plan and demonstrated an understanding of the instructions.   The patient was advised to call back or seek an in-person evaluation if the symptoms worsen or if the condition fails to improve as anticipated.   Total minutes including chart review and phone contact time: 27   Follow up plan: Return in about 6 months (around 03/14/2020), or if symptoms worsen or fail to improve.  Claretta Fraise, MD [place]

## 2019-09-22 ENCOUNTER — Encounter: Payer: Self-pay | Admitting: Family Medicine

## 2019-09-30 DIAGNOSIS — R002 Palpitations: Secondary | ICD-10-CM | POA: Insufficient documentation

## 2019-09-30 DIAGNOSIS — Z7189 Other specified counseling: Secondary | ICD-10-CM

## 2019-09-30 HISTORY — DX: Other specified counseling: Z71.89

## 2019-09-30 NOTE — Progress Notes (Signed)
Cardiology Office Note   Date:  10/01/2019   ID:  Rodney Ruiz, DOB November 08, 1980, MRN [id number]  PCP:  Claretta Fraise, MD  Cardiologist:   No primary care provider on file. Referring:  Claretta Fraise, MD  No chief complaint on file.     History of Present Illness: Rodney Ruiz is a 39 y.o. male who is referred by Claretta Fraise, MD for evaluation of palpitations.  Patient has no past cardiac history.  He was evaluated not long ago for possible sleep apnea and there was no evidence of this.  I reviewed these records.  He has episodes where he wakes up every night.  His breathing is heavy.  Will feel his heart beating hard but not necessarily fast.  He has been breathing heavily.  Let it calm down and after a minute or 2 he can go back to bed.  He is not any of the symptoms during the day.  He is not having chest pressure, neck or arm discomfort.  He does not get short of breath and has no PND or orthopnea.  He was drinking heavy caffeine only 1 drink today.  Recently stopped smoking.  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 Constipation   . Mass of finger of right hand 11/2015   middle finger  . Panic attacks     Past Surgical History:  Procedure Laterality Date  . CLOSED REDUCTION FINGER WITH PERCUTANEOUS PINNING Right 08/20/2015   Procedure: CLOSED REDUCTION WITH PERCUTANEOUS PINNING DISTAL PHALANX RIGHT LONG AND RING FINGERS;  Surgeon: Daryll Brod, MD;  Location: [place];  Service: Orthopedics;  Laterality: Right;  . MASS EXCISION Right 12/09/2015   Procedure: EXCISION MASS RIGHT MIDDLE FINGER,PROXIMAL INTERPHALANGEAL JOINT;  Surgeon: Daryll Brod, MD;  Location: [place];  Service: Orthopedics;  Laterality: Right;  . NAILBED REPAIR Right 08/20/2015   Procedure: NAILBED REPAIR RIGHT LONG RING AND SMALL FINGERS;  Surgeon: Daryll Brod, MD;  Location: [place];  Service: Orthopedics;   Laterality: Right;     Current Outpatient Medications  Medication Sig Dispense Refill  . pantoprazole (PROTONIX) 40 MG tablet Take 1 tablet (40 mg total) by mouth daily. 90 tablet 1  . psyllium (METAMUCIL) 58.6 % packet Take 1 packet by mouth 2 (two) times daily.    . sertraline (ZOLOFT) 100 MG tablet Take 1 tablet (100 mg total) by mouth at bedtime. For anxiety and depression 90 tablet 1   No current facility-administered medications for this visit.    Allergies:   Patient has no known allergies.    Social History:  The patient  reports that he has quit smoking. His smoking use included cigarettes. He started smoking about 23 years ago. He has a 20.00 pack-year smoking history. He has never used smokeless tobacco. He reports that he does not drink alcohol or use drugs.   Family History:  The patient's family history includes Cancer in his mother.    ROS:  Please see the history of present illness.   Otherwise, review of systems are positive for none.   All other systems are reviewed and negative.    PHYSICAL EXAM: VS:  BP 124/72   Pulse 65   Temp (!) 97.2 F (36.2 C)   Ht 5\' 11"  (1.803 m)   Wt 194 lb (88 kg)   SpO2 99%   BMI 27.06 kg/m  , BMI Body mass index  is 27.06 kg/m. GENERAL:  Well appearing HEENT:  Pupils equal round and reactive, fundi not visualized, oral mucosa unremarkable NECK:  No jugular venous distention, waveform within normal limits, carotid upstroke brisk and symmetric, no bruits, no thyromegaly LYMPHATICS:  No cervical, inguinal adenopathy LUNGS:  Clear to auscultation bilaterally BACK:  No CVA tenderness CHEST:  Unremarkable HEART:  PMI not displaced or sustained,S1 and S2 within normal limits, no S3, no S4, no clicks, no rubs, no murmurs ABD:  Flat, positive bowel sounds normal in frequency in pitch, no bruits, no rebound, no guarding, no midline pulsatile mass, no hepatomegaly, no splenomegaly EXT:  2 plus pulses throughout, no edema, no cyanosis no  clubbing SKIN:  No rashes no nodules NEURO:  Cranial nerves II through XII grossly intact, motor grossly intact throughout PSYCH:  Cognitively intact, oriented to person place and time    EKG:  EKG is ordered today. The ekg ordered today demonstrates sinus rhythm, rate 66, axis within normal limits, intervals within normal limits, no acute ST-T wave changes.   Recent Labs: No results found for requested labs within last 8760 hours.    Lipid Panel No results found for: CHOL, TRIG, HDL, CHOLHDL, VLDL, LDLCALC, LDLDIRECT    Wt Readings from Last 3 Encounters:  10/01/19 194 lb (88 kg)  03/13/19 194 lb (88 kg)  10/02/18 190 lb 3.2 oz (86.3 kg)      Other studies Reviewed: Additional studies/ records that were reviewed today include: None. Review of the above records demonstrates:  Please see elsewhere in the note.     ASSESSMENT AND PLAN:  PALPITATIONS:    He is having these every night.  I will place think a Holter and check labs to include TSH basic metabolic profile and magnesium.  Further treatment will be based on these results.  COVID EDUCATION:    We talked briefly about the vaccine.  Current medicines are reviewed at length with the patient today.  The patient does not have concerns regarding medicines.  The following changes have been made:  no change  Labs/ tests ordered today include:   Orders Placed This Encounter  Procedures  . Basic metabolic panel  . TSH  . Magnesium  . LONG TERM MONITOR (3-14 DAYS)     Disposition:   FU with me as needed based on the results of the above.     Signed, Minus Breeding, MD  10/01/2019 4:51 PM    [facility]

## 2019-10-01 ENCOUNTER — Ambulatory Visit: Payer: BC Managed Care – PPO | Admitting: Cardiology

## 2019-10-01 ENCOUNTER — Other Ambulatory Visit: Payer: Self-pay

## 2019-10-01 ENCOUNTER — Encounter: Payer: Self-pay | Admitting: Cardiology

## 2019-10-01 ENCOUNTER — Encounter: Payer: Self-pay | Admitting: *Deleted

## 2019-10-01 VITALS — BP 124/72 | HR 65 | Temp 97.2°F | Ht 71.0 in | Wt 194.0 lb

## 2019-10-01 DIAGNOSIS — R002 Palpitations: Secondary | ICD-10-CM | POA: Diagnosis not present

## 2019-10-01 DIAGNOSIS — Z7189 Other specified counseling: Secondary | ICD-10-CM | POA: Diagnosis not present

## 2019-10-01 NOTE — Progress Notes (Signed)
Patient ID: Rodney Ruiz, male   DOB: [birth year], 39 y.o.   MRN: [id number] Patient enrolled for [employer] to mail a 3 day ZIO XT long term holter monitor to his home.

## 2019-10-01 NOTE — Patient Instructions (Addendum)
Medication Instructions:  No Changes *If you need a refill on your cardiac medications before your next appointment, please call your pharmacy*  Lab Work: Your physician recommends that you return for lab work with your PCP (TSH, BMP, Mag) If you have labs (blood work) drawn today and your tests are completely normal, you will receive your results only by: Marland Kitchen MyChart Message (if you have MyChart) OR . A paper copy in the mail If you have any lab test that is abnormal or we need to change your treatment, we will call you to review the results.  Testing/Procedures: Your physician has recommended that you wear an event monitor. Event monitors are medical devices that record the heart's electrical activity. Doctors most often use these monitors to diagnose arrhythmias. Arrhythmias are problems with the speed or rhythm of the heartbeat. The monitor is a small, portable device. You can wear one while you do your normal daily activities. This is usually used to diagnose what is causing palpitations/syncope (passing out).  Follow-Up: At [facility], you and your health needs are our priority.  As part of our continuing mission to provide you with exceptional heart care, we have created designated Provider Care Teams.  These Care Teams include your primary Cardiologist (physician) and Advanced Practice Providers (APPs -  Physician Assistants and Nurse Practitioners) who all work together to provide you with the care you need, when you need it.  FOLLOW UP AS NEEDED   Other Instructions ZIO XT- Long Term Monitor Instructions   Your physician has requested you wear your ZIO patch monitor_3_days.   This is a single patch monitor.  [employer] supplies one patch monitor per enrollment.  Additional stickers are not available.   Please do not apply patch if you will be having a Nuclear Stress Test, Echocardiogram, Cardiac CT, MRI, or Chest Xray during the time frame you would be wearing the monitor. The  patch cannot be worn during these tests.  You cannot remove and re-apply the ZIO XT patch monitor.   Your ZIO patch monitor will be sent USPS Priority mail from [facility] directly to your home address. The monitor may also be mailed to a PO BOX if home delivery is not available.   It may take 3-5 days to receive your monitor after you have been enrolled.   Once you have received you monitor, please review enclosed instructions.  Your monitor has already been registered assigning a specific monitor serial # to you.   Applying the monitor   Shave hair from upper left chest.   Hold abrader disc by orange tab.  Rub abrader in 40 strokes over left upper chest as indicated in your monitor instructions.   Clean area with 4 enclosed alcohol pads .  Use all pads to assure are is cleaned thoroughly.  Let dry.   Apply patch as indicated in monitor instructions.  Patch will be place under collarbone on left side of chest with arrow pointing upward.   Rub patch adhesive wings for 2 minutes.Remove white label marked "1".  Remove white label marked "2".  Rub patch adhesive wings for 2 additional minutes.   While looking in a mirror, press and release button in center of patch.  A small green light will flash 3-4 times .  This will be your only indicator the monitor has been turned on.     Do not shower for the first 24 hours.  You may shower after the first 24 hours.   Press  button if you feel a symptom. You will hear a small click.  Record Date, Time and Symptom in the Patient Log Book.   When you are ready to remove patch, follow instructions on last 2 pages of Patient Log Book.  Stick patch monitor onto last page of Patient Log Book.   Place Patient Log Book in [place] box.  Use locking tab on box and tape box closed securely.  The [employer] and [employer] has [employer] on it.  Please place in mailbox as soon as possible.  Your physician should have your test results approximately 7 days  after the monitor has been mailed back to [facility].   Call [employer] at [phone] if you have questions regarding your ZIO XT patch monitor.  Call them immediately if you see an orange light blinking on your monitor.   If your monitor falls off in less than 4 days contact our Monitor department at [phone].  If your monitor becomes loose or falls off after 4 days call [employer] at [phone] for suggestions on securing your monitor.

## 2019-10-03 ENCOUNTER — Other Ambulatory Visit (INDEPENDENT_AMBULATORY_CARE_PROVIDER_SITE_OTHER): Payer: BC Managed Care – PPO

## 2019-10-03 DIAGNOSIS — R002 Palpitations: Secondary | ICD-10-CM

## 2019-10-03 NOTE — Progress Notes (Signed)
EKG order placed

## 2019-10-07 ENCOUNTER — Ambulatory Visit (INDEPENDENT_AMBULATORY_CARE_PROVIDER_SITE_OTHER): Payer: BC Managed Care – PPO

## 2019-10-07 DIAGNOSIS — R002 Palpitations: Secondary | ICD-10-CM | POA: Diagnosis not present

## 2019-10-17 ENCOUNTER — Other Ambulatory Visit: Payer: BC Managed Care – PPO

## 2019-10-18 LAB — MAGNESIUM: Magnesium: 2.2 mg/dL (ref 1.6–2.3)

## 2019-10-18 LAB — BASIC METABOLIC PANEL
BUN/Creatinine Ratio: 18 (ref 9–20)
BUN: 14 mg/dL (ref 6–24)
CO2: 25 mmol/L (ref 20–29)
Calcium: 10 mg/dL (ref 8.7–10.2)
Chloride: 101 mmol/L (ref 96–106)
Creatinine, Ser: 0.8 mg/dL (ref 0.76–1.27)
GFR calc Af Amer: 129 mL/min/{1.73_m2} (ref >59)
GFR calc non Af Amer: 112 mL/min/{1.73_m2} (ref >59)
Glucose: 91 mg/dL (ref 65–99)
Potassium: 4 mmol/L (ref 3.5–5.2)
Sodium: 140 mmol/L (ref 134–144)

## 2019-10-18 LAB — TSH: TSH: 1.79 u[IU]/mL (ref 0.450–4.500)

## 2019-12-15 ENCOUNTER — Other Ambulatory Visit: Payer: Self-pay | Admitting: Family Medicine

## 2020-03-15 ENCOUNTER — Other Ambulatory Visit: Payer: Self-pay | Admitting: Family Medicine

## 2020-03-15 NOTE — Telephone Encounter (Signed)
Last OV 09/15/19. Last RF 30 day supply given today. Next OV not scheduled  Needs appt for further refills

## 2020-04-10 ENCOUNTER — Other Ambulatory Visit: Payer: Self-pay | Admitting: Family Medicine

## 2020-04-12 NOTE — Telephone Encounter (Signed)
Stacks. NTBS 30 days given 03/15/20 °

## 2020-04-12 NOTE — Telephone Encounter (Signed)
lmtcb to scheduled follow up appt

## 2020-05-07 ENCOUNTER — Ambulatory Visit: Payer: BC Managed Care – PPO | Admitting: Family Medicine

## 2020-05-07 ENCOUNTER — Encounter: Payer: Self-pay | Admitting: Family Medicine

## 2020-05-07 ENCOUNTER — Other Ambulatory Visit: Payer: Self-pay

## 2020-05-07 VITALS — BP 132/81 | HR 61 | Temp 97.8°F | Ht 71.0 in | Wt 193.4 lb

## 2020-05-07 DIAGNOSIS — F5101 Primary insomnia: Secondary | ICD-10-CM | POA: Diagnosis not present

## 2020-05-07 DIAGNOSIS — K2100 Gastro-esophageal reflux disease with esophagitis, without bleeding: Secondary | ICD-10-CM

## 2020-05-07 DIAGNOSIS — R0600 Dyspnea, unspecified: Secondary | ICD-10-CM | POA: Diagnosis not present

## 2020-05-07 DIAGNOSIS — R0689 Other abnormalities of breathing: Secondary | ICD-10-CM | POA: Diagnosis not present

## 2020-05-07 MED ORDER — PANTOPRAZOLE SODIUM 40 MG PO TBEC: 40.0000 mg | DELAYED_RELEASE_TABLET | Freq: Every day | ORAL | 3 refills | Status: DC

## 2020-05-07 MED ORDER — SERTRALINE HCL 100 MG PO TABS: ORAL_TABLET | ORAL | 5 refills | Status: DC

## 2020-05-07 NOTE — Progress Notes (Signed)
Subjective:  Patient ID: Rodney Ruiz, male    DOB: 12/03/1980  Age: 39 y.o. MRN: [id number]  CC: Follow-up    HPI Rodney Ruiz presents for Patient in for follow-up of GERD.  Unfortunately he has run out of his pantoprazole and has a great deal of reflux and is anxious to get some relief and wants his medicine sent in immediately.  He is asymptomatic when taking  PPI daily. There is no chest pain or heartburn. No hematemesis and no melena. No dysphagia or choking. Onset is remote. Progression is stable. Complicating factors, none.  Patient states he is having intermittent insomnia it will last for a month at a time then go away for several weeks and then come back.  He says he is having trouble breathing in the night he wakes up feeling short of breath and his mouth is full of mucus.  This is happening whether he takes the pantoprazole or not.  He says that during these times he will only get 3 to 4 hours of sleep per night.  Depression screen [facility] 2/9 05/07/2020 03/13/2019 10/02/2018  Decreased Interest 0 0 0  Down, Depressed, Hopeless 0 0 0  PHQ - 2 Score 0 0 0  Altered sleeping - - -  Tired, decreased energy - - -  Change in appetite - - -  Feeling bad or failure about yourself  - - -  Trouble concentrating - - -  Moving slowly or fidgety/restless - - -  Suicidal thoughts - - -  PHQ-9 Score - - -    History Rodney Ruiz has a past medical history of Constipation, Mass of finger of right hand (11/2015), and Panic attacks.   He has a past surgical history that includes Nailbed repair (Right, 08/20/2015); Closed reduction finger with percutaneous pinning (Right, 08/20/2015); and Mass excision (Right, 12/09/2015).   His family history includes Cancer in his mother.He reports that he has quit smoking. His smoking use included cigarettes. He started smoking about 24 years ago. He has a 20.00 pack-year smoking history. He has never used smokeless tobacco. He reports that he does not drink alcohol  and does not use drugs.    ROS Review of Systems  Constitutional: Negative.   HENT: Negative.   Eyes: Negative for visual disturbance.  Respiratory: Positive for shortness of breath. Negative for cough.   Cardiovascular: Negative for chest pain and leg swelling.  Gastrointestinal: Positive for abdominal pain (epiugastric- Heartburn). Negative for diarrhea, nausea and vomiting.  Genitourinary: Negative for difficulty urinating.  Musculoskeletal: Negative for arthralgias and myalgias.  Skin: Negative for rash.  Neurological: Negative for headaches.  Psychiatric/Behavioral: Positive for sleep disturbance.    Objective:  BP 132/81    Pulse 61    Temp 97.8 F (36.6 C) (Temporal)    Ht 5\' 11"  (1.803 m)    Wt 193 lb 6.4 oz (87.7 kg)    BMI 26.97 kg/m   BP Readings from Last 3 Encounters:  05/07/20 132/81  10/01/19 124/72  03/13/19 126/82    Wt Readings from Last 3 Encounters:  05/07/20 193 lb 6.4 oz (87.7 kg)  10/01/19 194 lb (88 kg)  03/13/19 194 lb (88 kg)     Physical Exam Vitals reviewed.  Constitutional:      Appearance: He is well-developed.  HENT:     Head: Normocephalic and atraumatic.     Right Ear: Tympanic membrane and external ear normal. No decreased hearing noted.     Left Ear: Tympanic membrane  and external ear normal. No decreased hearing noted.     Mouth/Throat:     Pharynx: No oropharyngeal exudate or posterior oropharyngeal erythema.  Eyes:     Pupils: Pupils are equal, round, and reactive to light.  Cardiovascular:     Rate and Rhythm: Normal rate and regular rhythm.     Heart sounds: No murmur heard.   Pulmonary:     Effort: No respiratory distress.     Breath sounds: Normal breath sounds.  Abdominal:     General: Bowel sounds are normal.     Palpations: Abdomen is soft. There is no mass.     Tenderness: There is no abdominal tenderness.  Musculoskeletal:     Cervical back: Normal range of motion and neck supple.       Assessment &  Plan:   Rodney Ruiz was seen today for follow-up.  Diagnoses and all orders for this visit:  Primary insomnia  Dyspnea and respiratory abnormality -     Ambulatory referral to Pulmonology  Gastroesophageal reflux disease with esophagitis without hemorrhage  Other orders -     sertraline (ZOLOFT) 100 MG tablet; TAKE 2 TABLETS (200 MG TOTAL) BY MOUTH AT BEDTIME. FOR ANXIETY AND DEPRESSION -     pantoprazole (PROTONIX) 40 MG tablet; Take 1 tablet (40 mg total) by mouth daily.       I have changed Rodney Ruiz's pantoprazole. I am also having him maintain his psyllium and sertraline.  Allergies as of 05/07/2020   No Known Allergies     Medication List       Accurate as of May 07, 2020  5:21 PM. If you have any questions, ask your nurse or doctor.        pantoprazole 40 MG tablet Commonly known as: PROTONIX Take 1 tablet (40 mg total) by mouth daily. What changed: additional instructions Changed by: Claretta Fraise, MD   psyllium 58.6 % packet Commonly known as: METAMUCIL Take 1 packet by mouth 2 (two) times daily.   sertraline 100 MG tablet Commonly known as: ZOLOFT TAKE 2 TABLETS (200 MG TOTAL) BY MOUTH AT BEDTIME. FOR ANXIETY AND DEPRESSION        Follow-up: Return in about 6 months (around 11/04/2020), or if symptoms worsen or fail to improve, for Depression, Anxiety, GERD.  Claretta Fraise, M.D.

## 2020-07-09 ENCOUNTER — Institutional Professional Consult (permissible substitution): Payer: BC Managed Care – PPO | Admitting: Internal Medicine

## 2020-07-09 ENCOUNTER — Other Ambulatory Visit: Payer: Self-pay

## 2020-08-13 ENCOUNTER — Ambulatory Visit: Payer: BC Managed Care – PPO | Admitting: Pulmonary Disease

## 2020-08-13 ENCOUNTER — Other Ambulatory Visit: Payer: Self-pay

## 2020-08-13 ENCOUNTER — Encounter: Payer: Self-pay | Admitting: Pulmonary Disease

## 2020-08-13 VITALS — BP 146/80 | HR 68 | Temp 97.3°F | Ht 70.0 in | Wt 202.0 lb

## 2020-08-13 DIAGNOSIS — R0683 Snoring: Secondary | ICD-10-CM | POA: Diagnosis not present

## 2020-08-13 NOTE — Patient Instructions (Signed)
Will arrange for home sleep study Will call to arrange for follow up after sleep study reviewed  

## 2020-08-13 NOTE — Progress Notes (Signed)
[place] [facility]  Chief Complaint  Patient presents with  . Consult    Sleep consult     Constitutional:  BP (!) 146/80 (BP Location: Right Arm, Cuff Size: Normal)   Pulse 68   Temp (!) 97.3 F (36.3 C) (Other (Comment)) Comment (Src): wrist  Ht 5\' 10"  (1.778 m)   Wt 202 lb (91.6 kg)   SpO2 98% Comment: Room air  BMI 28.98 kg/m   Past Medical History:  Panic attacks  Past Surgical History:  His  has a past surgical history that includes Nailbed repair (Right, 08/20/2015); Closed reduction finger with percutaneous pinning (Right, 08/20/2015); and Mass excision (Right, 12/09/2015).  Brief Summary:  Rodney Ruiz is a 39 y.o. male former smoker with sleep trouble.      Subjective:   He has noticed trouble with his sleep for few years.  He had sleep study in 2019.  Didn't have enough events for sleep apnea, but had increased PLMI.  He only occasionally gets leg symptoms.  He wakes up feeling panicked and his heart racing.  He feels like his throat is closing and like he has mucus in his throat.  He snores some.  He has gained about 20 lbs since he had sleep study in 2019.  He stopped smoking to see if this would help, but his sleep troubles persist.  He gets sleep while watching TV.  He will sometimes talk in his sleep.   He wakes up 1 or 2 times to use the bathroom.  He feels okay in the morning.  He denies morning headache.  He does not use anything to help him fall sleep or stay awake.  He denies sleep walking, sleep talking, bruxism, or nightmares.  There is no history of restless legs.  He denies sleep hallucinations, sleep paralysis, or cataplexy.  The Epworth score is 6 out of 24.    Physical Exam:   Appearance - well kempt   ENMT - no sinus tenderness, no oral exudate, no LAN, Mallampati 3 airway, no stridor, elongated uvula  Respiratory - equal breath sounds bilaterally, no wheezing or rales  CV - s1s2 regular rate and  rhythm, no murmurs  Ext - no clubbing, no edema  Skin - no rashes  Psych - normal mood and affect   Pulmonary testing:    Chest Imaging:    Sleep Tests:   PSG 01/14/18 >> AHI 0.5, SpO2 low 87%, PLMI 18.8  Cardiac Tests:    Social History:  He  reports that he quit smoking about 14 months ago. His smoking use included cigarettes. He started smoking about 24 years ago. He has a 40.50 pack-year smoking history. He has never used smokeless tobacco. He reports that he does not drink alcohol and does not use drugs.  Family History:  His family history includes Cancer in his mother.    Discussion:  He has snoring, sleep disruption, apnea, and daytime sleepiness.  He has history of mood disorder. He had previous sleep study that showed a few respiratory events, but not sufficient enough to qualify for obstructive sleep apnea.  Since then he has gained weight and his sleep issues have gotten worse.  I am concerned he could have sleep apnea now.  Assessment/Plan:   Snoring with excessive daytime sleepiness. - will need to arrange for a home sleep study  Obesity. - discussed how weight can impact sleep and risk for sleep disordered breathing - discussed options to assist  with weight loss: combination of diet modification, cardiovascular and strength training exercises  Cardiovascular risk. - had an extensive discussion regarding the adverse health consequences related to untreated sleep disordered breathing - specifically discussed the risks for hypertension, coronary artery disease, cardiac dysrhythmias, cerebrovascular disease, and diabetes - lifestyle modification discussed  Safe driving practices. - discussed how sleep disruption can increase risk of accidents, particularly when driving - safe driving practices were discussed  Therapies for obstructive sleep apnea. - if the sleep study shows significant sleep apnea, then various therapies for treatment were reviewed: CPAP,  oral appliance, and surgical interventions  Time Spent Involved in Patient Care on Day of Examination:  32 minutes  Follow up:  Patient Instructions  Will arrange for home sleep study Will call to arrange for follow up after sleep study reviewed    Medication List:   Allergies as of 08/13/2020   No Known Allergies     Medication List       Accurate as of August 13, 2020 10:07 AM. If you have any questions, ask your nurse or doctor.        pantoprazole 40 MG tablet Commonly known as: PROTONIX Take 1 tablet (40 mg total) by mouth daily.   psyllium 58.6 % packet Commonly known as: METAMUCIL Take 1 packet by mouth 2 (two) times daily.   sertraline 100 MG tablet Commonly known as: ZOLOFT TAKE 2 TABLETS (200 MG TOTAL) BY MOUTH AT BEDTIME. FOR ANXIETY AND DEPRESSION       Signature:  Chesley Mires, MD [place] Pager - [phone] 08/13/2020, 10:07 AM

## 2020-09-07 ENCOUNTER — Other Ambulatory Visit: Payer: Self-pay

## 2020-09-07 ENCOUNTER — Ambulatory Visit: Payer: BC Managed Care – PPO

## 2020-09-07 DIAGNOSIS — R0683 Snoring: Secondary | ICD-10-CM

## 2020-09-07 DIAGNOSIS — G4733 Obstructive sleep apnea (adult) (pediatric): Secondary | ICD-10-CM

## 2020-09-10 ENCOUNTER — Telehealth: Payer: Self-pay | Admitting: Pulmonary Disease

## 2020-09-10 DIAGNOSIS — G4733 Obstructive sleep apnea (adult) (pediatric): Secondary | ICD-10-CM

## 2020-09-10 NOTE — Telephone Encounter (Signed)
Called and went over HST results per Dr Halford Chessman with patient. All questions answered and patient expressed full understanding. Scheduled office visit with Dr Halford Chessman at the [facility] office for Monday 09/13/2020 at 4:30pm. Patient agreeable to time, date and location. Nothing further needed at this time.

## 2020-09-10 NOTE — Telephone Encounter (Signed)
HST 09/07/20 >> AHI 6.1, SpO2 low 85%  Please inform him that his sleep study shows mild obstructive sleep apnea.  Please arrange for ROV with me or NP to discuss treatment options.

## 2020-09-13 ENCOUNTER — Ambulatory Visit: Payer: BC Managed Care – PPO | Admitting: Pulmonary Disease

## 2021-04-19 ENCOUNTER — Other Ambulatory Visit: Payer: Self-pay | Admitting: Family Medicine

## 2021-05-14 ENCOUNTER — Other Ambulatory Visit: Payer: Self-pay | Admitting: Family Medicine

## 2021-05-20 ENCOUNTER — Telehealth: Payer: Self-pay | Admitting: Family Medicine

## 2021-05-20 NOTE — Telephone Encounter (Signed)
Please call patient to schedule med refill appt. Pt is almost out of his medicine and doesn't have anymore refills. Dr Livia Snellen doesn't have any openings until November.

## 2021-05-23 NOTE — Telephone Encounter (Signed)
Called patient, no answer, mailbox full

## 2021-05-26 ENCOUNTER — Other Ambulatory Visit: Payer: Self-pay | Admitting: Family Medicine

## 2021-06-27 ENCOUNTER — Encounter: Payer: Self-pay | Admitting: Family Medicine

## 2021-06-27 ENCOUNTER — Ambulatory Visit: Payer: BC Managed Care – PPO | Admitting: Family Medicine

## 2021-06-27 ENCOUNTER — Other Ambulatory Visit: Payer: Self-pay

## 2021-06-27 VITALS — BP 109/63 | HR 54 | Temp 97.7°F | Ht 70.0 in | Wt 188.4 lb

## 2021-06-27 DIAGNOSIS — D171 Benign lipomatous neoplasm of skin and subcutaneous tissue of trunk: Secondary | ICD-10-CM

## 2021-06-27 DIAGNOSIS — D485 Neoplasm of uncertain behavior of skin: Secondary | ICD-10-CM | POA: Diagnosis not present

## 2021-06-27 DIAGNOSIS — F3342 Major depressive disorder, recurrent, in full remission: Secondary | ICD-10-CM | POA: Diagnosis not present

## 2021-06-27 DIAGNOSIS — K219 Gastro-esophageal reflux disease without esophagitis: Secondary | ICD-10-CM | POA: Diagnosis not present

## 2021-06-27 MED ORDER — SERTRALINE HCL 100 MG PO TABS: 100.0000 mg | ORAL_TABLET | Freq: Every day | ORAL | 5 refills | Status: DC

## 2021-06-27 MED ORDER — PANTOPRAZOLE SODIUM 40 MG PO TBEC
40.0000 mg | DELAYED_RELEASE_TABLET | Freq: Every day | ORAL | 3 refills | Status: DC
Start: 2021-06-27 — End: 2022-04-10

## 2021-06-27 NOTE — Progress Notes (Signed)
Subjective:  Patient ID: Rodney Ruiz, male    DOB: 03-25-1981  Age: 40 y.o. MRN: [id number]  CC: Medical Management of Chronic Issues   HPI Rodney Ruiz presents for Patient in for follow-up of GERD. Currently asymptomatic taking  PPI daily. There is no chest pain or heartburn. May flare based on foods consumed. Rare as long as Rodney Ruiz takes  PPI daily. No hematemesis and no melena. No dysphagia or choking. Onset is remote. Progression is stable. Complicating factors, none.  Sleeping better since Rodney Ruiz started working out.   Twenty years ago had moles Rodney Ruiz needed removed. Would like those done here.   Depression screen [facility] 2/9 06/27/2021 05/07/2020 03/13/2019  Decreased Interest 0 0 0  Down, Depressed, Hopeless 0 0 0  PHQ - 2 Score 0 0 0  Altered sleeping - - -  Tired, decreased energy - - -  Change in appetite - - -  Feeling bad or failure about yourself  - - -  Trouble concentrating - - -  Moving slowly or fidgety/restless - - -  Suicidal thoughts - - -  PHQ-9 Score - - -    History Rodney Ruiz has a past medical history of Constipation, Mass of finger of right hand (11/2015), and Panic attacks.   Rodney Ruiz has a past surgical history that includes Nailbed repair (Right, 08/20/2015); Closed reduction finger with percutaneous pinning (Right, 08/20/2015); and Mass excision (Right, 12/09/2015).   His family history includes Cancer in his mother.Rodney Ruiz reports that Rodney Ruiz quit smoking about 2 years ago. His smoking use included cigarettes. Rodney Ruiz started smoking about 25 years ago. Rodney Ruiz has a 40.50 pack-year smoking history. Rodney Ruiz has never used smokeless tobacco. Rodney Ruiz reports that Rodney Ruiz does not drink alcohol and does not use drugs.    ROS Review of Systems  Constitutional:  Negative for fever.  Respiratory:  Negative for shortness of breath.   Cardiovascular:  Negative for chest pain.  Musculoskeletal:  Negative for arthralgias.  Skin:  Negative for rash.       Lipoma at right preaxillary area gets irritated when Rodney Ruiz  runs due to swing of the arm rubbing against it. Rodney Ruiz runs as part of his routine exercise regimen.   Objective:  BP 109/63   Pulse (!) 54   Temp 97.7 F (36.5 C)   Ht 5\' 10"  (1.778 m)   Wt 188 lb 6.4 oz (85.5 kg)   SpO2 96%   BMI 27.03 kg/m   BP Readings from Last 3 Encounters:  06/27/21 109/63  08/13/20 (!) 146/80  05/07/20 132/81    Wt Readings from Last 3 Encounters:  06/27/21 188 lb 6.4 oz (85.5 kg)  08/13/20 202 lb (91.6 kg)  05/07/20 193 lb 6.4 oz (87.7 kg)     Physical Exam Constitutional:      General: Rodney Ruiz is not in acute distress.    Appearance: Rodney Ruiz is well-developed.  HENT:     Head: Normocephalic and atraumatic.     Right Ear: External ear normal.     Left Ear: External ear normal.     Nose: Nose normal.  Eyes:     Conjunctiva/sclera: Conjunctivae normal.     Pupils: Pupils are equal, round, and reactive to light.  Cardiovascular:     Rate and Rhythm: Normal rate and regular rhythm.     Heart sounds: Normal heart sounds. No murmur heard. Pulmonary:     Effort: Pulmonary effort is normal. No respiratory distress.     Breath sounds: Normal breath sounds.  No wheezing or rales.  Abdominal:     Palpations: Abdomen is soft.     Tenderness: There is no abdominal tenderness.  Musculoskeletal:        General: Normal range of motion.     Cervical back: Normal range of motion and neck supple.  Skin:    General: Skin is warm and dry.     Findings: Lesion (at the right chest, near the anterior axillary line, just superior to the nipple is a 1X3 cm lipoma.Also three lesions at upper back near base of neck. Raised, 83mm each one with a red base.) present.  Neurological:     Mental Status: Rodney Ruiz is alert and oriented to person, place, and time.     Deep Tendon Reflexes: Reflexes are normal and symmetric.  Psychiatric:        Behavior: Behavior normal.        Thought Content: Thought content normal.        Judgment: Judgment normal.      Assessment & Plan:    Rodney Ruiz was seen today for medical management of chronic issues.  Diagnoses and all orders for this visit:  Neoplasm of uncertain behavior of skin  Recurrent major depressive disorder, in full remission ([place])  Lipoma of anterior chest wall -     Ambulatory referral to General Surgery  Gastroesophageal reflux disease without esophagitis  Other orders -     pantoprazole (PROTONIX) 40 MG tablet; Take 1 tablet (40 mg total) by mouth daily. (NEEDS TO BE SEEN BEFORE NEXT REFILL) -     sertraline (ZOLOFT) 100 MG tablet; Take 1 tablet (100 mg total) by mouth at bedtime. FOR ANXIETY AND DEPRESSION      I have changed Rodney Ruiz's sertraline. I am also having him maintain his psyllium and pantoprazole.  Allergies as of 06/27/2021   No Known Allergies      Medication List        Accurate as of June 27, 2021 12:14 PM. If you have any questions, ask your nurse or doctor.          pantoprazole 40 MG tablet Commonly known as: PROTONIX Take 1 tablet (40 mg total) by mouth daily. (NEEDS TO BE SEEN BEFORE NEXT REFILL)   psyllium 58.6 % packet Commonly known as: METAMUCIL Take 1 packet by mouth 2 (two) times daily.   sertraline 100 MG tablet Commonly known as: ZOLOFT Take 1 tablet (100 mg total) by mouth at bedtime. FOR ANXIETY AND DEPRESSION What changed:  how much to take how to take this when to take this additional instructions Changed by: Claretta Fraise, MD       Rodney Ruiz can schedule for shvae bx here for the moles.   Follow-up: Return in about 1 month (around 07/28/2021) for  skin lesion removal and 1 year for CPE.  Claretta Fraise, M.D.

## 2021-07-21 ENCOUNTER — Encounter: Payer: Self-pay | Admitting: General Surgery

## 2021-07-21 ENCOUNTER — Ambulatory Visit: Payer: BC Managed Care – PPO | Admitting: General Surgery

## 2021-07-21 ENCOUNTER — Other Ambulatory Visit: Payer: Self-pay

## 2021-07-21 VITALS — BP 136/72 | HR 56 | Temp 98.7°F | Resp 16 | Ht 70.0 in | Wt 187.0 lb

## 2021-07-21 DIAGNOSIS — D171 Benign lipomatous neoplasm of skin and subcutaneous tissue of trunk: Secondary | ICD-10-CM

## 2021-07-21 NOTE — Patient Instructions (Signed)
Lipoma Removal Lipoma removal is a surgical procedure to remove a lipoma, which is a noncancerous (benign) tumor that is made up of fat cells. Most lipomas are small and painless and do not require treatment. They can form in many areas of the body but are most common under the skin of the back, arms, shoulders, buttocks, and thighs. You may need lipoma removal if you have a lipoma that is large, growing, or causing discomfort. Lipoma removal may also be done for cosmetic reasons. Tell a health care provider about: Any allergies you have. All medicines you are taking, including vitamins, herbs, eye drops, creams, and over-the-counter medicines. Any problems you or family members have had with anesthetic medicines. Any blood disorders you have. Any surgeries you have had. Any medical conditions you have. Whether you are pregnant or may be pregnant. What are the risks? Generally, this is a safe procedure. However, problems may occur, including: Infection. Bleeding. Scarring. Allergic reactions to medicines. Damage to nearby structures or organs, such as damage to nerves or blood vessels near the lipoma. What happens before the procedure? Medicines Ask your health care provider about: Changing or stopping your regular medicines. This is especially important if you are taking diabetes medicines or blood thinners. Taking medicines such as aspirin and ibuprofen. These medicines can thin your blood. Do not take these medicines unless your health care provider tells you to take them. Taking over-the-counter medicines, vitamins, herbs, and supplements. General instructions You will have a physical exam. Your health care provider will check the size of the lipoma and whether it can be moved easily. You may have a biopsy and imaging tests, such as X-rays, a CT scan, and an MRI. Do not use any products that contain nicotine or tobacco for at least 4 weeks before the procedure. These products include  cigarettes, e-cigarettes, and chewing tobacco. If you need help quitting, ask your health care provider. Ask your health care provider: How your surgery site will be marked. What steps will be taken to help prevent infection. These may include: Washing skin with a germ-killing soap. Taking antibiotic medicine. Plan to have someone take you home from the hospital or clinic. If you will be going home right after the procedure, plan to have someone with you for 24 hours. What happens during the procedure?  An IV will be inserted into one of your veins. You will be given one or more of the following: A medicine to help you relax (sedative). A medicine to numb the area (local anesthetic). A medicine to make you fall asleep (general anesthetic). A medicine that is injected into an area of your body to numb everything below the injection site (regional anesthetic). An incision will be made over the lipoma or very near the lipoma. The incision may be made in a natural skin line or crease. Tissues, nerves, and blood vessels near the lipoma will be moved out of the way. The lipoma and the capsule that surrounds it will be separated from the surrounding tissues. The lipoma will be removed. The incision may be closed with stitches (sutures). A bandage (dressing) will be placed over the incision. The procedure may vary among health care providers and hospitals. What happens after the procedure? Your blood pressure, heart rate, breathing rate, and blood oxygen level will be monitored until you leave the hospital or clinic. If you were prescribed an antibiotic medicine, use it as told by your health care provider. Do not stop using the antibiotic even if  you start to feel better. If you were given a sedative during the procedure, it can affect you for several hours. Do not drive or operate machinery until your health care provider says that it is safe. Summary Before the procedure, follow instructions  from your health care provider about eating and drinking, and changing or stopping your regular medicines. This is especially important if you are taking diabetes medicines or blood thinners. After the lipoma is removed, the incision may be closed with stitches (sutures) and covered with a bandage (dressing). If you were given a sedative during the procedure, it can affect you for several hours. Do not drive or operate machinery until your health care provider says that it is safe. This information is not intended to replace advice given to you by your health care provider. Make sure you discuss any questions you have with your health care provider. Document Revised: 04/07/2019 Document Reviewed: 04/07/2019 Elsevier Patient Education  [employer].

## 2021-07-21 NOTE — Progress Notes (Signed)
[facility] History and Physical  Reason for Referral: Lipoma right chest Rodney Ruiz Referring Physician:  Claretta Fraise, MD   Chief Complaint   New Patient (Initial Visit)     Rodney Ruiz is a 40 y.o. male.  HPI: Rodney Ruiz is a 40 yo who is otherwise healthy and takes few medications. He has noticed an area on his right axilla/ chest that has been there for years but has been getting larger. The area causes him discomfort with exercising which he tries to do regularly.  When he runs the arm swings against it and irritates the area. He denies any rapid growth or infections in the area.   Past Medical History:  Diagnosis Date   Constipation    Mass of finger of right hand 11/2015   middle finger   Panic attacks     Past Surgical History:  Procedure Laterality Date   CLOSED REDUCTION FINGER WITH PERCUTANEOUS PINNING Right 08/20/2015   Procedure: CLOSED REDUCTION WITH PERCUTANEOUS PINNING DISTAL PHALANX RIGHT LONG AND RING FINGERS;  Surgeon: Daryll Brod, MD;  Location: [place];  Service: Orthopedics;  Laterality: Right;   MASS EXCISION Right 12/09/2015   Procedure: EXCISION MASS RIGHT MIDDLE FINGER,PROXIMAL INTERPHALANGEAL JOINT;  Surgeon: Daryll Brod, MD;  Location: [place];  Service: Orthopedics;  Laterality: Right;   NAILBED REPAIR Right 08/20/2015   Procedure: NAILBED REPAIR RIGHT LONG RING AND SMALL FINGERS;  Surgeon: Daryll Brod, MD;  Location: [place];  Service: Orthopedics;  Laterality: Right;    Family History  Problem Relation Age of Onset   Cancer Mother        breast    Social History   Tobacco Use   Smoking status: Former    Packs/day: 1.50    Years: 27.00    Pack years: 40.50    Types: Cigarettes    Start date: 02/29/1996    Quit date: 05/30/2019    Years since quitting: 2.1   Smokeless tobacco: Never  Vaping Use   Vaping Use: Never used  Substance Use Topics   Alcohol use: No   Drug use:  No    Medications: I have reviewed the patient's current medications. Allergies as of 07/21/2021   No Known Allergies      Medication List        Accurate as of July 21, 2021  3:49 PM. If you have any questions, ask your nurse or doctor.          pantoprazole 40 MG tablet Commonly known as: PROTONIX Take 1 tablet (40 mg total) by mouth daily. (NEEDS TO BE SEEN BEFORE NEXT REFILL)   psyllium 58.6 % packet Commonly known as: METAMUCIL Take 1 packet by mouth 2 (two) times daily.   sertraline 100 MG tablet Commonly known as: ZOLOFT Take 1 tablet (100 mg total) by mouth at bedtime. FOR ANXIETY AND DEPRESSION         ROS:  A comprehensive review of systems was negative except for: Musculoskeletal: positive for right axillary mass/ discomfort  Blood pressure 136/72, pulse (!) 56, temperature 98.7 F (37.1 C), temperature source Other (Comment), resp. rate 16, height 5\' 10"  (1.778 m), weight 187 lb (84.8 kg), SpO2 96 %. Physical Exam Vitals reviewed.  Constitutional:      Appearance: Normal appearance.  HENT:     Head: Normocephalic.     Nose: Nose normal.  Eyes:     Extraocular Movements: Extraocular movements intact.  Cardiovascular:  Rate and Rhythm: Normal rate and regular rhythm.  Pulmonary:     Effort: Pulmonary effort is normal.     Breath sounds: Normal breath sounds.  Abdominal:     General: There is no distension.     Palpations: Abdomen is soft.     Tenderness: There is no abdominal tenderness.  Musculoskeletal:        General: Normal range of motion.     Cervical back: Normal range of motion.  Lymphadenopathy:     Upper Body:     Right upper body: No axillary adenopathy.     Left upper body: No axillary adenopathy.     Comments: Right torso just in front of the axilla 8-10cm superficial mass, spongy  Skin:    General: Skin is warm.  Neurological:     General: No focal deficit present.     Mental Status: He is alert and oriented to  person, place, and time.  Psychiatric:        Mood and Affect: Mood normal.        Behavior: Behavior normal.        Thought Content: Thought content normal.    Results: None   Assessment & Plan:  Rodney Ruiz is a 40 y.o. male with a lipoma that he wants removed. Discussed excision under local and risk of bleeding, infection, recurrence, being something other than a lipoma.   Will do in the office.   Future Appointments  Date Time Provider [place]  08/01/2021  3:55 PM Claretta Fraise, MD [facility] None  08/11/2021  3:15 PM Virl Cagey, MD [facility] None  06/27/2022  2:10 PM Claretta Fraise, MD [facility] None     All questions were answered to the satisfaction of the patient.    Virl Cagey 07/21/2021, 3:49 PM

## 2021-08-01 ENCOUNTER — Ambulatory Visit: Payer: BC Managed Care – PPO | Admitting: Family Medicine

## 2021-08-01 ENCOUNTER — Other Ambulatory Visit (HOSPITAL_COMMUNITY)
Admission: RE | Admit: 2021-08-01 | Discharge: 2021-08-01 | Disposition: A | Discharge status: Home / Self Care | Payer: BC Managed Care – PPO | Source: Ambulatory Visit | Attending: Family Medicine | Admitting: Family Medicine

## 2021-08-01 ENCOUNTER — Encounter: Payer: Self-pay | Admitting: Family Medicine

## 2021-08-01 VITALS — BP 115/62 | HR 56 | Temp 98.2°F | Ht 70.0 in | Wt 187.2 lb

## 2021-08-01 DIAGNOSIS — D485 Neoplasm of uncertain behavior of skin: Secondary | ICD-10-CM | POA: Diagnosis present

## 2021-08-01 NOTE — Progress Notes (Signed)
No chief complaint on file.   HPI  Patient presents today for lesion removal. Four moles on the upper back have become irritated, itch, burn and occasionally bleed.   PMH: Smoking status noted ROS: Per HPI  Objective: BP 115/62   Pulse (!) 56   Temp 98.2 F (36.8 C)   Ht 5\' 10"  (1.778 m)   Wt 187 lb 3.2 oz (84.9 kg)   SpO2 96%   BMI 26.86 kg/m  Gen: NAD, alert, cooperative with exam Ext: No edema, warm Skin: three lesions lined up one over the other near midline of thoracic back levels 3,4 & 6. The fourth is near the angle of the left scapula.  Neuro: Alert and oriented, No gross deficits  Assessment and plan:  .Shave Biopsy Procedure Note  Pre-operative Diagnosis: Suspicious lesions X 4  Post-operative Diagnosis: same  Locations:upper upper body and back  Indications: suspicious for melanoma  Anesthesia: Lidocaine 2% with epinephrine without added sodium bicarbonate  Procedure Details  History of allergy to iodine: no  Patient informed of the risks (including bleeding and infection) and benefits of the  procedure and Verbal informed consent obtained.  The lesion and surrounding area were given a sterile prep using betadyne and draped in the usual sterile fashion. A scalpel was used to shave an area of skin approximately 2cm by 2cm.  Hemostasis achieved with monopolar electrodesiccation. Antibiotic ointment and a sterile dressing applied.  The specimen were sent for pathologic examination. The patient tolerated the procedure well.  EBL: <1 ml  Findings: Unremarkable skin biopsy X 4  Condition: Stable  Complications: none.  Plan: 1. Instructed to keep the wound dry and covered for 24-48h and clean thereafter. 2. Warning signs of infection were reviewed.   3. Recommended that the patient use OTC acetaminophen as needed for pain.  4. Return i prn  . 1. Neoplasm of uncertain behavior of skin    Specimens to path X 4 Follow up as needed.  Claretta Fraise,  MD

## 2021-08-04 LAB — SURGICAL PATHOLOGY

## 2021-08-11 ENCOUNTER — Encounter: Payer: Self-pay | Admitting: General Surgery

## 2021-08-11 ENCOUNTER — Other Ambulatory Visit: Payer: Self-pay

## 2021-08-11 ENCOUNTER — Ambulatory Visit: Payer: BC Managed Care – PPO | Admitting: General Surgery

## 2021-08-11 VITALS — BP 143/84 | HR 58 | Temp 98.7°F | Resp 16 | Ht 70.0 in | Wt 189.0 lb

## 2021-08-11 DIAGNOSIS — D171 Benign lipomatous neoplasm of skin and subcutaneous tissue of trunk: Secondary | ICD-10-CM | POA: Diagnosis not present

## 2021-08-11 MED ORDER — OXYCODONE HCL 5 MG PO TABS: 5.0000 mg | ORAL_TABLET | ORAL | 0 refills | Status: DC | PRN

## 2021-08-11 NOTE — Patient Instructions (Signed)
Keep ACE wrap in place for 24 hours if able.  Common Complaints: Pain at the incision site is common. Bruising is common.  You may develop a seroma (fluid pocket) this will decrease with time.   Activity: Shower per your regular routine daily.  Do not take hot showers. Take warm showers that are less than 10 minutes. Limit excessive movement, lifting > 10 lbs, stretching with the limb if there is an incision on your arm/armpit or leg.   Limit stretching, pulling on your incision if it is located on other parts of your body.  Do not pick at the dermabond glue on your incision sites. This glue film will remain in place for 1-2 weeks and will start to peel off.  Do not place lotions or balms on your incision unless instructed to specifically by Dr. Constance Haw.   Medication: Take tylenol and ibuprofen as needed for pain control, alternating every 4-6 hours.  Example:  Tylenol 1000mg  @ 6am, 12noon, 6pm, 27midnight (Do not exceed 4000mg  of tylenol a day). Ibuprofen 800mg  @ 9am, 3pm, 9pm, 3am (Do not exceed 3600mg  of ibuprofen a day).  Take Roxicodone for breakthrough pain every 4 hours.  Take Colace for constipation related to narcotic pain medication. If you do not have a bowel movement in 2 days, take Miralax over the counter.  Drink plenty of water to also prevent constipation.   Contact Information: If you have questions or concerns, please call our office, [phone], Monday- Thursday 8AM-5PM and Friday 8AM-12Noon.  If it is after hours or on the weekend, please call [facility], [phone], [phone], and ask to speak to the surgeon on call for Dr. Constance Haw at [facility].

## 2021-08-13 NOTE — Progress Notes (Signed)
[facility] Procedure Note  08/11/2021  Preoperative Diagnosis: Right torso/ flank lipoma    Postoperative Diagnosis: Same   Procedure(s) Performed: Excision of lipoma 10cm   Surgeon: Ria Comment C. Constance Haw, MD   Anesthesia: 1% lidocaine with epinephrine    Specimens: Lipoma    Wound Class:Clean    Operative Indications:  Rodney Ruiz is a 40 yo with a lipoma that bothers him with running. We discussed excision in the office and risk of bleeding, infection, finding something other than a lipoma and recurrence.   Findings: Lipoma 10cm    Procedure: The patient was taken to the procedure room and placed supine with his right arm elevated above his head. The right torso/ flank under the axilla was prepared and draped in the usual sterile fashion. Lidocaine was injected. An incision was made with a scalpel. Using sharp dissection with scissors and blunt dissection with hemostat the lipoma was removed in its entirety. The lipoma measured 10cm. The cavity was made hemostatic. The deep space was closed with 3-0 Vicryl interrupted and the skin was closed with 4-0 running subcuticular and dermabond. An ACE was placed around the chest to help with compression to try to prevent seroma formation.   The patient tolerated the procedure well. The lipoma was sent to pathology.   A printed Rx for Roxicodone was given the to the patient and instructions for ibuprofen/ tylenol and post operative care.  No heaving lifting for 1-2 weeks.   Will call to check on in a few weeks.  Future Appointments  Date Time Provider [place]  08/25/2021  4:00 PM Virl Cagey, MD [facility] None  06/27/2022  2:10 PM Claretta Fraise, MD [facility] None      Curlene Labrum, MD [facility] [address] [place], [place] [address] [phone] (office)

## 2021-08-19 LAB — PATHOLOGY REPORT

## 2021-08-19 LAB — TISSUE SPECIMEN

## 2021-08-25 ENCOUNTER — Ambulatory Visit (INDEPENDENT_AMBULATORY_CARE_PROVIDER_SITE_OTHER): Payer: BC Managed Care – PPO | Admitting: General Surgery

## 2021-08-25 DIAGNOSIS — D171 Benign lipomatous neoplasm of skin and subcutaneous tissue of trunk: Secondary | ICD-10-CM

## 2021-08-25 NOTE — Progress Notes (Signed)
[facility]  I am calling the patient for post operative evaluation. This is not a billable encounter as it is under the [place] charges for the surgery.  The patient had a a lipoma excision on 12/8. The patient reports that he is doing well and had minimal pain. His incision is healing well.   Pathology: Lipoma   Will see the patient PRN.   Curlene Labrum, MD [facility] [address] [place], [place] [address] [phone] (office)

## 2022-04-10 ENCOUNTER — Ambulatory Visit: Payer: BC Managed Care – PPO | Admitting: Family Medicine

## 2022-04-10 ENCOUNTER — Encounter: Payer: Self-pay | Admitting: Family Medicine

## 2022-04-10 VITALS — BP 121/61 | HR 65 | Temp 97.9°F | Ht 70.0 in | Wt 201.0 lb

## 2022-04-10 DIAGNOSIS — F331 Major depressive disorder, recurrent, moderate: Secondary | ICD-10-CM | POA: Diagnosis not present

## 2022-04-10 MED ORDER — PANTOPRAZOLE SODIUM 40 MG PO TBEC: 40.0000 mg | DELAYED_RELEASE_TABLET | Freq: Every day | ORAL | 3 refills | Status: DC

## 2022-04-10 MED ORDER — SERTRALINE HCL 100 MG PO TABS: 100.0000 mg | ORAL_TABLET | Freq: Every day | ORAL | 5 refills | Status: DC

## 2022-04-10 MED ORDER — SERTRALINE HCL 100 MG PO TABS: 200.0000 mg | ORAL_TABLET | Freq: Every day | ORAL | 1 refills | Status: DC

## 2022-04-10 NOTE — Progress Notes (Signed)
Subjective:  Patient ID: Rodney Ruiz, male    DOB: [birth year]  Age: 41 y.o. MRN: [id number]  CC: Depression   HPI Rodney Ruiz presents for two weeks of increasing feeling bad. Crying spells. Increased his sertraline and feels better. Started 1 week ago. Sad depressing toughts had increased. Doesn't know why. No life changes.      04/10/2022   10:37 AM 04/10/2022   10:33 AM 08/01/2021    3:52 PM  Depression screen PHQ 2/9  Decreased Interest 2 0 0  Down, Depressed, Hopeless 3 0 0  PHQ - 2 Score 5 0 0  Altered sleeping 1    Tired, decreased energy 3    Change in appetite 1    Feeling bad or failure about yourself  3    Trouble concentrating 1    Moving slowly or fidgety/restless 0    Suicidal thoughts 3    PHQ-9 Score 17    Difficult doing work/chores Very difficult      History Rodney Ruiz has a past medical history of Constipation, Mass of finger of right hand (11/2015), and Panic attacks.   Rodney Ruiz has a past surgical history that includes Nailbed repair (Right, 08/20/2015); Closed reduction finger with percutaneous pinning (Right, 08/20/2015); and Mass excision (Right, 12/09/2015).   His family history includes Cancer in his mother.Rodney Ruiz reports that Rodney Ruiz quit smoking about 2 years ago. His smoking use included cigarettes. Rodney Ruiz started smoking about 26 years ago. Rodney Ruiz has a 40.50 pack-year smoking history. Rodney Ruiz has never used smokeless tobacco. Rodney Ruiz reports that Rodney Ruiz does not drink alcohol and does not use drugs.    ROS Review of Systems  Constitutional:  Negative for fever.  Respiratory:  Negative for shortness of breath.   Cardiovascular:  Negative for chest pain.  Musculoskeletal:  Negative for arthralgias.  Skin:  Negative for rash.    Objective:  BP 121/61   Pulse 65   Temp 97.9 F (36.6 C)   Ht '5\' 10"'$  (1.778 m)   Wt 201 lb (91.2 kg)   SpO2 93%   BMI 28.84 kg/m   BP Readings from Last 3 Encounters:  04/10/22 121/61  08/11/21 (!) 143/84  08/01/21 115/62    Wt Readings from  Last 3 Encounters:  04/10/22 201 lb (91.2 kg)  08/11/21 189 lb (85.7 kg)  08/01/21 187 lb 3.2 oz (84.9 kg)     Physical Exam Vitals reviewed.  Constitutional:      Appearance: Rodney Ruiz is well-developed.  HENT:     Head: Normocephalic and atraumatic.     Right Ear: External ear normal.     Left Ear: External ear normal.     Mouth/Throat:     Pharynx: No oropharyngeal exudate or posterior oropharyngeal erythema.  Eyes:     Pupils: Pupils are equal, round, and reactive to light.  Cardiovascular:     Rate and Rhythm: Normal rate and regular rhythm.     Heart sounds: No murmur heard. Pulmonary:     Effort: No respiratory distress.     Breath sounds: Normal breath sounds.  Musculoskeletal:     Cervical back: Normal range of motion and neck supple.  Neurological:     Mental Status: Rodney Ruiz is alert and oriented to person, place, and time.       Assessment & Plan:   Rodney Ruiz was seen today for depression.  Diagnoses and all orders for this visit:  Moderate episode of recurrent major depressive disorder ([place])  Other orders -  pantoprazole (PROTONIX) 40 MG tablet; Take 1 tablet (40 mg total) by mouth daily. -     Discontinue: sertraline (ZOLOFT) 100 MG tablet; Take 1 tablet (100 mg total) by mouth at bedtime. FOR ANXIETY AND DEPRESSION -     sertraline (ZOLOFT) 100 MG tablet; Take 2 tablets (200 mg total) by mouth at bedtime. FOR ANXIETY AND DEPRESSION       I have discontinued Rodney Ruiz's oxyCODONE and sertraline. I have also changed his pantoprazole and sertraline. Additionally, I am having him maintain his psyllium.  Allergies as of 04/10/2022   No Known Allergies      Medication List        Accurate as of April 10, 2022 11:01 AM. If you have any questions, ask your nurse or doctor.          STOP taking these medications    oxyCODONE 5 MG immediate release tablet Commonly known as: Roxicodone Stopped by: Claretta Fraise, MD       TAKE these medications     pantoprazole 40 MG tablet Commonly known as: PROTONIX Take 1 tablet (40 mg total) by mouth daily. What changed: additional instructions Changed by: Claretta Fraise, MD   psyllium 58.6 % packet Commonly known as: METAMUCIL Take 1 packet by mouth 2 (two) times daily.   sertraline 100 MG tablet Commonly known as: ZOLOFT Take 2 tablets (200 mg total) by mouth at bedtime. FOR ANXIETY AND DEPRESSION What changed: how much to take Changed by: Claretta Fraise, MD         Follow-up: Return in about 2 months (around 06/10/2022) for [facility] physical.  Claretta Fraise, M.D.

## 2022-04-25 ENCOUNTER — Ambulatory Visit: Payer: BC Managed Care – PPO | Admitting: Family Medicine

## 2022-06-13 ENCOUNTER — Encounter: Payer: Self-pay | Admitting: Family Medicine

## 2022-06-13 ENCOUNTER — Ambulatory Visit (INDEPENDENT_AMBULATORY_CARE_PROVIDER_SITE_OTHER): Payer: BC Managed Care – PPO | Admitting: Family Medicine

## 2022-06-13 VITALS — BP 131/80 | HR 68 | Temp 98.6°F | Ht 70.0 in | Wt 200.0 lb

## 2022-06-13 DIAGNOSIS — E559 Vitamin D deficiency, unspecified: Secondary | ICD-10-CM

## 2022-06-13 DIAGNOSIS — K2100 Gastro-esophageal reflux disease with esophagitis, without bleeding: Secondary | ICD-10-CM

## 2022-06-13 DIAGNOSIS — Z0000 Encounter for general adult medical examination without abnormal findings: Secondary | ICD-10-CM

## 2022-06-13 DIAGNOSIS — Z0001 Encounter for general adult medical examination with abnormal findings: Secondary | ICD-10-CM

## 2022-06-13 NOTE — Progress Notes (Signed)
Subjective:  Patient ID: Rodney Ruiz, male    DOB: [birth year]  Age: 41 y.o. MRN: [id number]  CC: Annual Exam   HPI Rodney Ruiz presents for Annual physical.     06/13/2022    3:37 PM 04/10/2022   10:37 AM 04/10/2022   10:33 AM  Depression screen PHQ 2/9  Decreased Interest 1 2 0  Down, Depressed, Hopeless 1 3 0  PHQ - 2 Score 2 5 0  Altered sleeping 0 1   Tired, decreased energy 1 3   Change in appetite 0 1   Feeling bad or failure about yourself  0 3   Trouble concentrating 0 1   Moving slowly or fidgety/restless 0 0   Suicidal thoughts 0 3   PHQ-9 Score 3 17   Difficult doing work/chores  Very difficult     History Rodney Ruiz has a past medical history of Constipation, Educated about COVID-19 virus infection (09/30/2019), Mass of finger of right hand (11/2015), and Panic attacks.   Rodney Ruiz has a past surgical history that includes Nailbed repair (Right, 08/20/2015); Closed reduction finger with percutaneous pinning (Right, 08/20/2015); and Mass excision (Right, 12/09/2015).   His family history includes Cancer in his mother.Rodney Ruiz reports that Rodney Ruiz quit smoking about 3 years ago. His smoking use included cigarettes. Rodney Ruiz started smoking about 26 years ago. Rodney Ruiz has a 40.50 pack-year smoking history. Rodney Ruiz has never used smokeless tobacco. Rodney Ruiz reports that Rodney Ruiz does not drink alcohol and does not use drugs.    ROS Review of Systems  Constitutional:  Negative for activity change, fatigue and unexpected weight change.  HENT:  Positive for congestion and postnasal drip. Negative for ear pain, hearing loss and trouble swallowing.   Eyes:  Negative for pain and visual disturbance.  Respiratory:  Negative for cough, chest tightness and shortness of breath.   Cardiovascular:  Negative for chest pain, palpitations and leg swelling.  Gastrointestinal:  Negative for abdominal distention, abdominal pain, blood in stool, constipation, diarrhea, nausea and vomiting.  Endocrine: Negative for cold intolerance,  heat intolerance and polydipsia.  Genitourinary:  Negative for difficulty urinating, dysuria, flank pain, frequency and urgency.  Musculoskeletal:  Negative for arthralgias and joint swelling.  Skin:  Negative for color change, rash and wound.  Neurological:  Positive for headaches. Negative for dizziness, syncope, speech difficulty, weakness, light-headedness and numbness.  Hematological:  Does not bruise/bleed easily.  Psychiatric/Behavioral:  Negative for confusion, decreased concentration, dysphoric mood and sleep disturbance. The patient is not nervous/anxious.     Objective:  BP 131/80   Pulse 68   Temp 98.6 F (37 C)   Ht 5' 10"  (1.778 m)   Wt 200 lb (90.7 kg)   SpO2 95%   BMI 28.70 kg/m   BP Readings from Last 3 Encounters:  06/13/22 131/80  04/10/22 121/61  08/11/21 (!) 143/84    Wt Readings from Last 3 Encounters:  06/13/22 200 lb (90.7 kg)  04/10/22 201 lb (91.2 kg)  08/11/21 189 lb (85.7 kg)     Physical Exam Constitutional:      Appearance: Rodney Ruiz is well-developed.  HENT:     Head: Normocephalic and atraumatic.  Eyes:     Pupils: Pupils are equal, round, and reactive to light.  Neck:     Thyroid: No thyromegaly.     Trachea: No tracheal deviation.  Cardiovascular:     Rate and Rhythm: Normal rate and regular rhythm.     Heart sounds: Normal heart sounds. No murmur heard.  No friction rub. No gallop.  Pulmonary:     Breath sounds: Normal breath sounds. No wheezing or rales.  Abdominal:     General: Bowel sounds are normal. There is no distension.     Palpations: Abdomen is soft. There is no mass.     Tenderness: There is no abdominal tenderness.     Hernia: There is no hernia in the left inguinal area.  Genitourinary:    Penis: Normal.      Testes: Normal.  Musculoskeletal:        General: Normal range of motion.     Cervical back: Normal range of motion.  Lymphadenopathy:     Cervical: No cervical adenopathy.  Skin:    General: Skin is warm  and dry.  Neurological:     Mental Status: Rodney Ruiz is alert and oriented to person, place, and time.       Assessment & Plan:   Rodney Ruiz was seen today for annual exam.  Diagnoses and all orders for this visit:  Well adult exam -     CBC with Differential/Platelet -     CMP14+EGFR -     Lipid panel -     Urinalysis  Gastroesophageal reflux disease with esophagitis without hemorrhage -     CBC with Differential/Platelet -     CMP14+EGFR -     Lipid panel -     Urinalysis -     VITAMIN D 25 Hydroxy (Vit-D Deficiency, Fractures)  Vitamin D deficiency -     VITAMIN D 25 Hydroxy (Vit-D Deficiency, Fractures)       I am having Rodney Ruiz maintain his psyllium, pantoprazole, and sertraline.  Allergies as of 06/13/2022   No Known Allergies      Medication List        Accurate as of June 13, 2022 11:59 PM. If you have any questions, ask your nurse or doctor.          pantoprazole 40 MG tablet Commonly known as: PROTONIX Take 1 tablet (40 mg total) by mouth daily.   psyllium 58.6 % packet Commonly known as: METAMUCIL Take 1 packet by mouth 2 (two) times daily.   sertraline 100 MG tablet Commonly known as: ZOLOFT Take 2 tablets (200 mg total) by mouth at bedtime. FOR ANXIETY AND DEPRESSION         Follow-up: Return in about 6 months (around 12/13/2022).  Claretta Fraise, M.D.

## 2022-06-19 ENCOUNTER — Other Ambulatory Visit: Payer: BC Managed Care – PPO

## 2022-06-19 LAB — URINALYSIS
Bilirubin, UA: NEGATIVE
Glucose, UA: NEGATIVE
Ketones, UA: NEGATIVE
Leukocytes,UA: NEGATIVE
Nitrite, UA: NEGATIVE
Protein,UA: NEGATIVE
RBC, UA: NEGATIVE
Specific Gravity, UA: 1.02 (ref 1.005–1.030)
Urobilinogen, Ur: 0.2 mg/dL (ref 0.2–1.0)
pH, UA: 6.5 (ref 5.0–7.5)

## 2022-06-20 ENCOUNTER — Other Ambulatory Visit: Payer: Self-pay | Admitting: *Deleted

## 2022-06-20 LAB — CBC WITH DIFFERENTIAL/PLATELET
Basophils Absolute: 0.1 10*3/uL (ref 0.0–0.2)
Basos: 1 %
EOS (ABSOLUTE): 0.2 10*3/uL (ref 0.0–0.4)
Eos: 2 %
Hematocrit: 45.5 % (ref 37.5–51.0)
Hemoglobin: 15.8 g/dL (ref 13.0–17.7)
Immature Grans (Abs): 0 10*3/uL (ref 0.0–0.1)
Immature Granulocytes: 1 %
Lymphocytes Absolute: 1.4 10*3/uL (ref 0.7–3.1)
Lymphs: 18 %
MCH: 32.6 pg (ref 26.6–33.0)
MCHC: 34.7 g/dL (ref 31.5–35.7)
MCV: 94 fL (ref 79–97)
Monocytes Absolute: 0.5 10*3/uL (ref 0.1–0.9)
Monocytes: 7 %
Neutrophils Absolute: 5.7 10*3/uL (ref 1.4–7.0)
Neutrophils: 71 %
Platelets: 222 10*3/uL (ref 150–450)
RBC: 4.84 x10E6/uL (ref 4.14–5.80)
RDW: 13.1 % (ref 11.6–15.4)
WBC: 7.9 10*3/uL (ref 3.4–10.8)

## 2022-06-20 LAB — LIPID PANEL
Chol/HDL Ratio: 4.9 ratio (ref 0.0–5.0)
Cholesterol, Total: 142 mg/dL (ref 100–199)
HDL: 29 mg/dL — ABNORMAL LOW (ref >39)
LDL Chol Calc (NIH): 92 mg/dL (ref 0–99)
Triglycerides: 111 mg/dL (ref 0–149)
VLDL Cholesterol Cal: 21 mg/dL (ref 5–40)

## 2022-06-20 LAB — CMP14+EGFR
ALT: 27 IU/L (ref 0–44)
AST: 20 IU/L (ref 0–40)
Albumin/Globulin Ratio: 1.8 (ref 1.2–2.2)
Albumin: 4.8 g/dL (ref 4.1–5.1)
Alkaline Phosphatase: 118 IU/L (ref 44–121)
BUN/Creatinine Ratio: 13 (ref 9–20)
BUN: 11 mg/dL (ref 6–24)
Bilirubin Total: 0.7 mg/dL (ref 0.0–1.2)
CO2: 25 mmol/L (ref 20–29)
Calcium: 9.5 mg/dL (ref 8.7–10.2)
Chloride: 100 mmol/L (ref 96–106)
Creatinine, Ser: 0.83 mg/dL (ref 0.76–1.27)
Globulin, Total: 2.6 g/dL (ref 1.5–4.5)
Glucose: 93 mg/dL (ref 70–99)
Potassium: 4.1 mmol/L (ref 3.5–5.2)
Sodium: 139 mmol/L (ref 134–144)
Total Protein: 7.4 g/dL (ref 6.0–8.5)
eGFR: 111 mL/min/{1.73_m2} (ref >59)

## 2022-06-20 LAB — VITAMIN D 25 HYDROXY (VIT D DEFICIENCY, FRACTURES): Vit D, 25-Hydroxy: 21.7 ng/mL — ABNORMAL LOW (ref 30.0–100.0)

## 2022-06-20 MED ORDER — VITAMIN D (ERGOCALCIFEROL) 1.25 MG (50000 UNIT) PO CAPS: 50000.0000 [IU] | ORAL_CAPSULE | ORAL | 3 refills | Status: DC

## 2022-06-20 NOTE — Progress Notes (Signed)
Dear Sherre Poot, Your Vitamin D is  low. You need a prescription strength supplement I will send that in for you. Nurse, if at all possible, could you send in a prescription for the patient for vitamin D 50,000 units, 1 p.o. weekly #13 with 3 refills? Many thanks, WS

## 2022-06-27 ENCOUNTER — Encounter: Payer: BC Managed Care – PPO | Admitting: Family Medicine

## 2022-10-06 ENCOUNTER — Other Ambulatory Visit: Payer: Self-pay | Admitting: Family Medicine

## 2022-12-13 ENCOUNTER — Ambulatory Visit: Payer: BC Managed Care – PPO | Admitting: Family Medicine

## 2022-12-13 ENCOUNTER — Encounter: Payer: Self-pay | Admitting: Family Medicine

## 2022-12-13 VITALS — BP 124/75 | HR 63 | Temp 97.4°F | Ht 70.0 in | Wt 202.2 lb

## 2022-12-13 DIAGNOSIS — F3342 Major depressive disorder, recurrent, in full remission: Secondary | ICD-10-CM | POA: Diagnosis not present

## 2022-12-13 DIAGNOSIS — E559 Vitamin D deficiency, unspecified: Secondary | ICD-10-CM | POA: Diagnosis not present

## 2022-12-13 DIAGNOSIS — K2100 Gastro-esophageal reflux disease with esophagitis, without bleeding: Secondary | ICD-10-CM

## 2022-12-13 DIAGNOSIS — R5383 Other fatigue: Secondary | ICD-10-CM | POA: Diagnosis not present

## 2022-12-13 MED ORDER — TRIAMCINOLONE ACETONIDE 0.1 % EX CREA: 1.0000 | TOPICAL_CREAM | Freq: Three times a day (TID) | CUTANEOUS | 0 refills | Status: DC

## 2022-12-13 MED ORDER — SERTRALINE HCL 100 MG PO TABS: 200.0000 mg | ORAL_TABLET | Freq: Every day | ORAL | 3 refills | Status: DC

## 2022-12-13 NOTE — Progress Notes (Signed)
Subjective:  Patient ID: Rodney Ruiz, male    DOB: July 02, 1981  Age: 42 y.o. MRN: [id number]  CC: Medical Management of Chronic Issues   HPI Rodney Ruiz presents for recheck of anxiety and depression. Doing well with increase of sertraline, however, energy isn't as good as he would like.   Patient in for follow-up of GERD. Currently asymptomatic taking  PPI daily. There is no chest pain or heartburn. No hematemesis and no melena. No dysphagia or choking. Onset is remote. Progression is stable. Complicating factors, none.      12/13/2022    4:09 PM 12/13/2022    3:43 PM 06/13/2022    3:37 PM  Depression screen PHQ 2/9  Decreased Interest 1 0 1  Down, Depressed, Hopeless 1 0 1  PHQ - 2 Score 2 0 2  Altered sleeping 2  0  Tired, decreased energy 0  1  Change in appetite 0  0  Feeling bad or failure about yourself  0  0  Trouble concentrating 0  0  Moving slowly or fidgety/restless 0  0  Suicidal thoughts 0  0  PHQ-9 Score 4  3  Difficult doing work/chores Somewhat difficult      History Tharun has a past medical history of Constipation, Educated about COVID-19 virus infection (09/30/2019), Mass of finger of right hand (11/2015), and Panic attacks.   He has a past surgical history that includes Nailbed repair (Right, 08/20/2015); Closed reduction finger with percutaneous pinning (Right, 08/20/2015); and Mass excision (Right, 12/09/2015).   His family history includes Cancer in his mother.He reports that he quit smoking about 3 years ago. His smoking use included cigarettes. He started smoking about 26 years ago. He has a 40.50 pack-year smoking history. He has never used smokeless tobacco. He reports that he does not drink alcohol and does not use drugs.    ROS Review of Systems  Constitutional:  Negative for fever.  Respiratory:  Negative for shortness of breath.   Cardiovascular:  Negative for chest pain.  Musculoskeletal:  Negative for arthralgias.  Skin:  Negative for  rash.    Objective:  BP 124/75   Pulse 63   Temp (!) 97.4 F (36.3 C)   Ht 5\' 10"  (1.778 m)   Wt 202 lb 3.2 oz (91.7 kg)   SpO2 93%   BMI 29.01 kg/m   BP Readings from Last 3 Encounters:  12/13/22 124/75  06/13/22 131/80  04/10/22 121/61    Wt Readings from Last 3 Encounters:  12/13/22 202 lb 3.2 oz (91.7 kg)  06/13/22 200 lb (90.7 kg)  04/10/22 201 lb (91.2 kg)     Physical Exam Vitals reviewed.  Constitutional:      Appearance: He is well-developed.  HENT:     Head: Normocephalic and atraumatic.     Right Ear: External ear normal.     Left Ear: External ear normal.     Mouth/Throat:     Pharynx: No oropharyngeal exudate or posterior oropharyngeal erythema.  Eyes:     Pupils: Pupils are equal, round, and reactive to light.  Cardiovascular:     Rate and Rhythm: Normal rate and regular rhythm.     Heart sounds: No murmur heard. Pulmonary:     Effort: No respiratory distress.     Breath sounds: Normal breath sounds.  Musculoskeletal:     Cervical back: Normal range of motion and neck supple.  Neurological:     Mental Status: He is alert and oriented to person,  place, and time.       Assessment & Plan:   Lewie was seen today for medical management of chronic issues.  Diagnoses and all orders for this visit:  Gastroesophageal reflux disease with esophagitis without hemorrhage -     CBC with Differential/Platelet -     CMP14+EGFR -     VITAMIN D 25 Hydroxy (Vit-D Deficiency, Fractures)  Recurrent major depressive disorder, in full remission -     CBC with Differential/Platelet  Vitamin D deficiency -     VITAMIN D 25 Hydroxy (Vit-D Deficiency, Fractures)  Fatigue, unspecified type -     CBC with Differential/Platelet -     TSH  Other orders -     sertraline (ZOLOFT) 100 MG tablet; Take 2 tablets (200 mg total) by mouth at bedtime. FOR ANXIETY AND DEPRESSION -     triamcinolone cream (KENALOG) 0.1 %; Apply 1 Application topically 3 (three) times  daily. Avoid face and genitalia       I am having Eldridge Dace start on triamcinolone cream. I am also having him maintain his psyllium, pantoprazole, Vitamin D (Ergocalciferol), and sertraline.  Allergies as of 12/13/2022   No Known Allergies      Medication List        Accurate as of December 13, 2022  9:24 PM. If you have any questions, ask your nurse or doctor.          pantoprazole 40 MG tablet Commonly known as: PROTONIX Take 1 tablet (40 mg total) by mouth daily.   psyllium 58.6 % packet Commonly known as: METAMUCIL Take 1 packet by mouth 2 (two) times daily.   sertraline 100 MG tablet Commonly known as: ZOLOFT Take 2 tablets (200 mg total) by mouth at bedtime. FOR ANXIETY AND DEPRESSION   triamcinolone cream 0.1 % Commonly known as: KENALOG Apply 1 Application topically 3 (three) times daily. Avoid face and genitalia Started by: Mechele Claude, MD   Vitamin D (Ergocalciferol) 1.25 MG (50000 UNIT) Caps capsule Commonly known as: DRISDOL Take 1 capsule (50,000 Units total) by mouth every 7 (seven) days.         Follow-up: Return in about 6 months (around 06/14/2023).  Mechele Claude, M.D.

## 2022-12-14 LAB — CBC WITH DIFFERENTIAL/PLATELET
Basophils Absolute: 0.1 10*3/uL (ref 0.0–0.2)
Basos: 1 %
EOS (ABSOLUTE): 0.2 10*3/uL (ref 0.0–0.4)
Eos: 3 %
Hematocrit: 45.7 % (ref 37.5–51.0)
Hemoglobin: 15.9 g/dL (ref 13.0–17.7)
Immature Grans (Abs): 0 10*3/uL (ref 0.0–0.1)
Immature Granulocytes: 0 %
Lymphocytes Absolute: 2.6 10*3/uL (ref 0.7–3.1)
Lymphs: 36 %
MCH: 32.3 pg (ref 26.6–33.0)
MCHC: 34.8 g/dL (ref 31.5–35.7)
MCV: 93 fL (ref 79–97)
Monocytes Absolute: 0.6 10*3/uL (ref 0.1–0.9)
Monocytes: 8 %
Neutrophils Absolute: 3.6 10*3/uL (ref 1.4–7.0)
Neutrophils: 52 %
Platelets: 246 10*3/uL (ref 150–450)
RBC: 4.93 x10E6/uL (ref 4.14–5.80)
RDW: 13 % (ref 11.6–15.4)
WBC: 7.1 10*3/uL (ref 3.4–10.8)

## 2022-12-14 LAB — CMP14+EGFR
ALT: 38 IU/L (ref 0–44)
AST: 24 IU/L (ref 0–40)
Albumin/Globulin Ratio: 1.7 (ref 1.2–2.2)
Albumin: 4.6 g/dL (ref 4.1–5.1)
Alkaline Phosphatase: 106 IU/L (ref 44–121)
BUN/Creatinine Ratio: 17 (ref 9–20)
BUN: 14 mg/dL (ref 6–24)
Bilirubin Total: 0.5 mg/dL (ref 0.0–1.2)
CO2: 26 mmol/L (ref 20–29)
Calcium: 10.1 mg/dL (ref 8.7–10.2)
Chloride: 101 mmol/L (ref 96–106)
Creatinine, Ser: 0.81 mg/dL (ref 0.76–1.27)
Globulin, Total: 2.7 g/dL (ref 1.5–4.5)
Glucose: 95 mg/dL (ref 70–99)
Potassium: 4.5 mmol/L (ref 3.5–5.2)
Sodium: 140 mmol/L (ref 134–144)
Total Protein: 7.3 g/dL (ref 6.0–8.5)
eGFR: 112 mL/min/{1.73_m2} (ref >59)

## 2022-12-14 LAB — TSH: TSH: 2.15 u[IU]/mL (ref 0.450–4.500)

## 2022-12-14 LAB — VITAMIN D 25 HYDROXY (VIT D DEFICIENCY, FRACTURES): Vit D, 25-Hydroxy: 16.2 ng/mL — ABNORMAL LOW (ref 30.0–100.0)

## 2022-12-18 NOTE — Progress Notes (Signed)
Dear Rodney Ruiz, Your Vitamin D is  low. You need a prescription strength supplement I will send that in for you. Nurse, if at all possible, could you send in a prescription for the patient for vitamin D 50,000 units, 1 p.o. weekly #13 with 3 refills? Many thanks, WS

## 2022-12-21 ENCOUNTER — Other Ambulatory Visit: Payer: Self-pay | Admitting: *Deleted

## 2022-12-21 ENCOUNTER — Telehealth: Payer: Self-pay | Admitting: Family Medicine

## 2022-12-21 MED ORDER — VITAMIN D (ERGOCALCIFEROL) 1.25 MG (50000 UNIT) PO CAPS: 50000.0000 [IU] | ORAL_CAPSULE | ORAL | 3 refills | Status: DC

## 2022-12-21 NOTE — Telephone Encounter (Signed)
Spoke with pt , he was confused , wasn't told he had refills on the Vit D

## 2022-12-21 NOTE — Telephone Encounter (Signed)
Pt said that he called pharmacy and was told that he could not refill the rx.

## 2022-12-21 NOTE — Telephone Encounter (Signed)
REFILLS SENT, PATIENT AWARE

## 2023-04-18 ENCOUNTER — Other Ambulatory Visit: Payer: Self-pay | Admitting: Family Medicine

## 2023-07-16 ENCOUNTER — Other Ambulatory Visit: Payer: Self-pay | Admitting: Family Medicine

## 2023-07-18 ENCOUNTER — Ambulatory Visit: Payer: BC Managed Care – PPO | Admitting: Family Medicine

## 2023-07-18 ENCOUNTER — Encounter: Payer: Self-pay | Admitting: Family Medicine

## 2023-07-18 VITALS — BP 129/67 | HR 62 | Temp 98.3°F | Ht 70.0 in | Wt 207.4 lb

## 2023-07-18 DIAGNOSIS — E559 Vitamin D deficiency, unspecified: Secondary | ICD-10-CM

## 2023-07-18 DIAGNOSIS — Z0000 Encounter for general adult medical examination without abnormal findings: Secondary | ICD-10-CM

## 2023-07-18 DIAGNOSIS — F3342 Major depressive disorder, recurrent, in full remission: Secondary | ICD-10-CM | POA: Diagnosis not present

## 2023-07-18 DIAGNOSIS — R002 Palpitations: Secondary | ICD-10-CM

## 2023-07-18 DIAGNOSIS — L409 Psoriasis, unspecified: Secondary | ICD-10-CM

## 2023-07-18 DIAGNOSIS — Z0001 Encounter for general adult medical examination with abnormal findings: Secondary | ICD-10-CM | POA: Diagnosis not present

## 2023-07-18 MED ORDER — PANTOPRAZOLE SODIUM 40 MG PO TBEC: 40.0000 mg | DELAYED_RELEASE_TABLET | Freq: Every day | ORAL | 3 refills | Status: DC

## 2023-07-18 MED ORDER — VITAMIN D (ERGOCALCIFEROL) 1.25 MG (50000 UNIT) PO CAPS: 50000.0000 [IU] | ORAL_CAPSULE | ORAL | 3 refills | Status: DC

## 2023-07-18 MED ORDER — ESCITALOPRAM OXALATE 10 MG PO TABS: 10.0000 mg | ORAL_TABLET | Freq: Every day | ORAL | 1 refills | Status: DC

## 2023-07-18 NOTE — Progress Notes (Signed)
Subjective:  Patient ID: Rodney Ruiz, male    DOB: 1981-02-16  Age: 42 y.o. MRN: [id number]  CC: Annual Exam   HPI Rodney Ruiz presents for CPE. Too sleepy with sertraline. Wants to change.     07/18/2023    3:31 PM 12/13/2022    4:09 PM 12/13/2022    3:43 PM  Depression screen PHQ 2/9  Decreased Interest 0 1 0  Down, Depressed, Hopeless 0 1 0  PHQ - 2 Score 0 2 0  Altered sleeping  2   Tired, decreased energy  0   Change in appetite  0   Feeling bad or failure about yourself   0   Trouble concentrating  0   Moving slowly or fidgety/restless  0   Suicidal thoughts  0   PHQ-9 Score  4   Difficult doing work/chores  Somewhat difficult     History Rodney Ruiz has a past medical history of Constipation, Educated about COVID-19 virus infection (09/30/2019), Mass of finger of right hand (11/2015), and Panic attacks.   He has a past surgical history that includes Nailbed repair (Right, 08/20/2015); Closed reduction finger with percutaneous pinning (Right, 08/20/2015); and Mass excision (Right, 12/09/2015).   His family history includes Cancer in his mother.He reports that he quit smoking about 4 years ago. His smoking use included cigarettes. He started smoking about 27 years ago. He has a 40.6 pack-year smoking history. He has never used smokeless tobacco. He reports that he does not drink alcohol and does not use drugs.    ROS Review of Systems  Constitutional:  Negative for activity change, fatigue and unexpected weight change.  HENT:  Negative for congestion, ear pain, hearing loss, postnasal drip and trouble swallowing.   Eyes:  Negative for pain and visual disturbance.  Respiratory:  Negative for cough, chest tightness and shortness of breath.   Cardiovascular:  Negative for chest pain, palpitations and leg swelling.  Gastrointestinal:  Negative for abdominal distention, abdominal pain, blood in stool, constipation, diarrhea, nausea and vomiting.  Endocrine: Negative for cold  intolerance, heat intolerance and polydipsia.  Genitourinary:  Negative for difficulty urinating, dysuria, flank pain, frequency and urgency.  Musculoskeletal:  Negative for arthralgias and joint swelling.  Skin:  Negative for color change, rash and wound.  Neurological:  Negative for dizziness, syncope, speech difficulty, weakness, light-headedness, numbness and headaches.  Hematological:  Does not bruise/bleed easily.  Psychiatric/Behavioral:  Negative for confusion, decreased concentration, dysphoric mood and sleep disturbance. The patient is not nervous/anxious.     Objective:  BP 129/67   Pulse 62   Temp 98.3 F (36.8 C)   Ht 5\' 10"  (1.778 m)   Wt 207 lb 6.4 oz (94.1 kg)   SpO2 93%   BMI 29.76 kg/m   BP Readings from Last 3 Encounters:  07/18/23 129/67  12/13/22 124/75  06/13/22 131/80    Wt Readings from Last 3 Encounters:  07/18/23 207 lb 6.4 oz (94.1 kg)  12/13/22 202 lb 3.2 oz (91.7 kg)  06/13/22 200 lb (90.7 kg)     Physical Exam    Assessment & Plan:   Rodney Ruiz was seen today for annual exam.  Diagnoses and all orders for this visit:  Well adult exam -     CBC with Differential/Platelet -     CMP14+EGFR -     Lipid panel -     Urinalysis  Vitamin D deficiency  Palpitations -     EKG 12-Lead  Recurrent major depressive disorder,  in full remission ([facility])  Psoriasis  Other orders -     pantoprazole (PROTONIX) 40 MG tablet; Take 1 tablet (40 mg total) by mouth daily. -     Vitamin D, Ergocalciferol, (DRISDOL) 1.25 MG (50000 UNIT) CAPS capsule; Take 1 capsule (50,000 Units total) by mouth every 7 (seven) days. -     escitalopram (LEXAPRO) 10 MG tablet; Take 1 tablet (10 mg total) by mouth daily.       I have discontinued Rodney Ruiz's sertraline. I have also changed his pantoprazole. Additionally, I am having him start on escitalopram. Lastly, I am having him maintain his psyllium, triamcinolone cream, and Vitamin D  (Ergocalciferol).  Allergies as of 07/18/2023   No Known Allergies      Medication List        Accurate as of July 18, 2023  4:33 PM. If you have any questions, ask your nurse or doctor.          STOP taking these medications    sertraline 100 MG tablet Commonly known as: ZOLOFT Stopped by: Rodney Ruiz       TAKE these medications    escitalopram 10 MG tablet Commonly known as: LEXAPRO Take 1 tablet (10 mg total) by mouth daily. Started by: Rodney Ruiz   pantoprazole 40 MG tablet Commonly known as: PROTONIX Take 1 tablet (40 mg total) by mouth daily. What changed: additional instructions Changed by: Rodney Ruiz   psyllium 58.6 % packet Commonly known as: METAMUCIL Take 1 packet by mouth 2 (two) times daily.   triamcinolone cream 0.1 % Commonly known as: KENALOG Apply 1 Application topically 3 (three) times daily. Avoid face and genitalia   Vitamin D (Ergocalciferol) 1.25 MG (50000 UNIT) Caps capsule Commonly known as: DRISDOL Take 1 capsule (50,000 Units total) by mouth every 7 (seven) days.         Follow-up: Return in about 1 month (around 08/17/2023) for Depression.  Rodney Ruiz, M.D.

## 2023-07-19 LAB — CBC WITH DIFFERENTIAL/PLATELET
Basophils Absolute: 0.1 10*3/uL (ref 0.0–0.2)
Basos: 1 %
EOS (ABSOLUTE): 0.4 10*3/uL (ref 0.0–0.4)
Eos: 4 %
Hematocrit: 46.9 % (ref 37.5–51.0)
Hemoglobin: 15.8 g/dL (ref 13.0–17.7)
Immature Grans (Abs): 0 10*3/uL (ref 0.0–0.1)
Immature Granulocytes: 0 %
Lymphocytes Absolute: 2.9 10*3/uL (ref 0.7–3.1)
Lymphs: 34 %
MCH: 32.8 pg (ref 26.6–33.0)
MCHC: 33.7 g/dL (ref 31.5–35.7)
MCV: 97 fL (ref 79–97)
Monocytes Absolute: 0.5 10*3/uL (ref 0.1–0.9)
Monocytes: 6 %
Neutrophils Absolute: 4.6 10*3/uL (ref 1.4–7.0)
Neutrophils: 55 %
Platelets: 270 10*3/uL (ref 150–450)
RBC: 4.82 x10E6/uL (ref 4.14–5.80)
RDW: 13.1 % (ref 11.6–15.4)
WBC: 8.6 10*3/uL (ref 3.4–10.8)

## 2023-07-19 LAB — LIPID PANEL
Chol/HDL Ratio: 6.7 ratio — ABNORMAL HIGH (ref 0.0–5.0)
Cholesterol, Total: 175 mg/dL (ref 100–199)
HDL: 26 mg/dL — ABNORMAL LOW (ref >39)
LDL Chol Calc (NIH): 101 mg/dL — ABNORMAL HIGH (ref 0–99)
Triglycerides: 279 mg/dL — ABNORMAL HIGH (ref 0–149)
VLDL Cholesterol Cal: 48 mg/dL — ABNORMAL HIGH (ref 5–40)

## 2023-07-19 LAB — URINALYSIS
Bilirubin, UA: NEGATIVE
Glucose, UA: NEGATIVE
Ketones, UA: NEGATIVE
Nitrite, UA: NEGATIVE
Protein,UA: NEGATIVE
RBC, UA: NEGATIVE
Specific Gravity, UA: 1.025 (ref 1.005–1.030)
Urobilinogen, Ur: 0.2 mg/dL (ref 0.2–1.0)
pH, UA: 7 (ref 5.0–7.5)

## 2023-07-19 LAB — CMP14+EGFR
ALT: 27 IU/L (ref 0–44)
AST: 20 IU/L (ref 0–40)
Albumin: 4.6 g/dL (ref 4.1–5.1)
Alkaline Phosphatase: 107 IU/L (ref 44–121)
BUN/Creatinine Ratio: 13 (ref 9–20)
BUN: 12 mg/dL (ref 6–24)
Bilirubin Total: 0.5 mg/dL (ref 0.0–1.2)
CO2: 23 mmol/L (ref 20–29)
Calcium: 10.1 mg/dL (ref 8.7–10.2)
Chloride: 101 mmol/L (ref 96–106)
Creatinine, Ser: 0.93 mg/dL (ref 0.76–1.27)
Globulin, Total: 3.2 g/dL (ref 1.5–4.5)
Glucose: 96 mg/dL (ref 70–99)
Potassium: 4.7 mmol/L (ref 3.5–5.2)
Sodium: 142 mmol/L (ref 134–144)
Total Protein: 7.8 g/dL (ref 6.0–8.5)
eGFR: 104 mL/min/{1.73_m2} (ref >59)

## 2023-08-16 ENCOUNTER — Ambulatory Visit: Payer: BC Managed Care – PPO | Admitting: Family Medicine

## 2023-08-21 ENCOUNTER — Ambulatory Visit: Payer: BC Managed Care – PPO | Admitting: Family Medicine

## 2023-08-21 ENCOUNTER — Encounter: Payer: Self-pay | Admitting: Family Medicine

## 2023-08-21 VITALS — BP 124/72 | HR 57 | Temp 97.6°F | Ht 70.0 in | Wt 212.6 lb

## 2023-08-21 DIAGNOSIS — F3342 Major depressive disorder, recurrent, in full remission: Secondary | ICD-10-CM

## 2023-08-21 NOTE — Progress Notes (Signed)
Subjective:  Patient ID: Rodney Ruiz, male    DOB: March 17, 1981  Age: 42 y.o. MRN: [id number]  CC: Follow-up   HPI Rodney Ruiz presents for recheck of depression. Tired a lot, but that is normal for him.     08/21/2023    4:34 PM 07/18/2023    3:31 PM 12/13/2022    4:09 PM  Depression screen PHQ 2/9  Decreased Interest 0 0 1  Down, Depressed, Hopeless 0 0 1  PHQ - 2 Score 0 0 2  Altered sleeping 0  2  Tired, decreased energy 1  0  Change in appetite 0  0  Feeling bad or failure about yourself  0  0  Trouble concentrating 0  0  Moving slowly or fidgety/restless 0  0  Suicidal thoughts 0  0  PHQ-9 Score 1  4  Difficult doing work/chores Somewhat difficult  Somewhat difficult    History Rodney Ruiz has a past medical history of Constipation, Educated about COVID-19 virus infection (09/30/2019), Mass of finger of right hand (11/2015), and Panic attacks.   He has a past surgical history that includes Nailbed repair (Right, 08/20/2015); Closed reduction finger with percutaneous pinning (Right, 08/20/2015); and Mass excision (Right, 12/09/2015).   His family history includes Cancer in his mother.He reports that he quit smoking about 4 years ago. His smoking use included cigarettes. He started smoking about 27 years ago. He has a 40.6 pack-year smoking history. He has never used smokeless tobacco. He reports that he does not drink alcohol and does not use drugs.    ROS Review of Systems  Constitutional:  Negative for activity change.  HENT: Negative.    Psychiatric/Behavioral:  Negative for agitation and sleep disturbance. The patient is not nervous/anxious.     Objective:  BP 124/72   Pulse (!) 57   Temp 97.6 F (36.4 C)   Ht 5\' 10"  (1.778 m)   Wt 212 lb 9.6 oz (96.4 kg)   SpO2 94%   BMI 30.50 kg/m   BP Readings from Last 3 Encounters:  08/21/23 124/72  07/18/23 129/67  12/13/22 124/75    Wt Readings from Last 3 Encounters:  08/21/23 212 lb 9.6 oz (96.4 kg)   07/18/23 207 lb 6.4 oz (94.1 kg)  12/13/22 202 lb 3.2 oz (91.7 kg)     Physical Exam Vitals reviewed.  Constitutional:      Appearance: He is well-developed.  HENT:     Head: Normocephalic and atraumatic.     Right Ear: External ear normal.     Left Ear: External ear normal.     Mouth/Throat:     Pharynx: No oropharyngeal exudate or posterior oropharyngeal erythema.  Eyes:     Pupils: Pupils are equal, round, and reactive to light.  Cardiovascular:     Rate and Rhythm: Normal rate and regular rhythm.     Heart sounds: No murmur heard. Pulmonary:     Effort: No respiratory distress.     Breath sounds: Normal breath sounds.  Musculoskeletal:     Cervical back: Normal range of motion and neck supple.  Neurological:     Mental Status: He is alert and oriented to person, place, and time.       Assessment & Plan:   [name] was seen today for follow-up.  Diagnoses and all orders for this visit:  Recurrent major depressive disorder, in full remission (HCC)       I am having Rodney Ruiz maintain his psyllium, triamcinolone cream, pantoprazole,  Vitamin D (Ergocalciferol), and escitalopram.  Allergies as of 08/21/2023   No Known Allergies      Medication List        Accurate as of August 21, 2023  6:27 PM. If you have any questions, ask your nurse or doctor.          escitalopram 10 MG tablet Commonly known as: LEXAPRO Take 1 tablet (10 mg total) by mouth daily.   pantoprazole 40 MG tablet Commonly known as: PROTONIX Take 1 tablet (40 mg total) by mouth daily.   psyllium 58.6 % packet Commonly known as: METAMUCIL Take 1 packet by mouth 2 (two) times daily.   triamcinolone cream 0.1 % Commonly known as: KENALOG Apply 1 Application topically 3 (three) times daily. Avoid face and genitalia   Vitamin D (Ergocalciferol) 1.25 MG (50000 UNIT) Caps capsule Commonly known as: DRISDOL Take 1 capsule (50,000 Units total) by mouth every 7 (seven) days.          Follow-up: Return in about 6 months (around 02/19/2024).  Mechele Claude, M.D.

## 2023-09-02 ENCOUNTER — Other Ambulatory Visit: Payer: Self-pay | Admitting: Family Medicine

## 2023-12-13 ENCOUNTER — Other Ambulatory Visit: Payer: Self-pay | Admitting: Family Medicine

## 2024-01-13 ENCOUNTER — Other Ambulatory Visit: Payer: Self-pay | Admitting: Family Medicine

## 2024-02-19 ENCOUNTER — Ambulatory Visit: Payer: BC Managed Care – PPO | Admitting: Family Medicine

## 2024-03-04 ENCOUNTER — Encounter: Payer: Self-pay | Admitting: Family Medicine

## 2024-03-04 ENCOUNTER — Ambulatory Visit: Admitting: Family Medicine

## 2024-03-04 VITALS — BP 106/66 | HR 71 | Temp 98.0°F | Ht 70.0 in | Wt 191.0 lb

## 2024-03-04 DIAGNOSIS — E559 Vitamin D deficiency, unspecified: Secondary | ICD-10-CM | POA: Diagnosis not present

## 2024-03-04 DIAGNOSIS — K2100 Gastro-esophageal reflux disease with esophagitis, without bleeding: Secondary | ICD-10-CM | POA: Diagnosis not present

## 2024-03-04 DIAGNOSIS — Z23 Encounter for immunization: Secondary | ICD-10-CM | POA: Diagnosis not present

## 2024-03-04 DIAGNOSIS — F3342 Major depressive disorder, recurrent, in full remission: Secondary | ICD-10-CM

## 2024-03-04 MED ORDER — SERTRALINE HCL 100 MG PO TABS: 200.0000 mg | ORAL_TABLET | Freq: Every day | ORAL | 3 refills | Status: AC

## 2024-03-04 NOTE — Progress Notes (Signed)
 Subjective:  Patient ID: Rodney Ruiz, male    DOB: 02-Sep-1981  Age: 43 y.o. MRN: [id number]  CC: Medical Management of Chronic Issues (Would like to go back on sertraline . Increased depression and anxiety. )   HPI Rodney Ruiz presents for follow-up on anxiety and depression.  He tried the S-Citalopram and it made him too jittery and wired feeling.  After couple of weeks he went back to the sertraline .  He has been taking that ever since he was here in December.  He has been doing quite well and and has no symptoms of depression according to the [facility] which he says was all zeros today  Rodney Ruiz also stopped taking the vitamin D .  He is wondering if he needs it anymore.  This is because he gets a lot of sun exposure especially on his arms and legs.  [place] for a short time to get a little bit more vitamin D   Patient in for follow-up of GERD. Currently asymptomatic taking  PPI daily. There is no chest pain or heartburn. No hematemesis and no melena. No dysphagia or choking. Onset is remote. Progression is stable. Complicating factors, none.      08/21/2023    4:34 PM 07/18/2023    3:31 PM 12/13/2022    4:09 PM  Depression screen PHQ 2/9  Decreased Interest 0 0 1  Down, Depressed, Hopeless 0 0 1  PHQ - 2 Score 0 0 2  Altered sleeping 0  2  Tired, decreased energy 1  0  Change in appetite 0  0  Feeling bad or failure about yourself  0  0  Trouble concentrating 0  0  Moving slowly or fidgety/restless 0  0  Suicidal thoughts 0  0  PHQ-9 Score 1  4  Difficult doing work/chores Somewhat difficult  Somewhat difficult    History Rodney Ruiz has a past medical history of Constipation, Educated about COVID-19 virus infection (09/30/2019), Mass of finger of right hand (11/2015), and Panic attacks.   He has a past surgical history that includes Nailbed repair (Right, 08/20/2015); Closed reduction finger with percutaneous pinning (Right, 08/20/2015); and  Mass excision (Right, 12/09/2015).   His family history includes Cancer in his mother.He reports that he quit smoking about 4 years ago. His smoking use included cigarettes. He started smoking about 28 years ago. He has a 40.6 pack-year smoking history. He has never used smokeless tobacco. He reports that he does not drink alcohol and does not use drugs.    ROS Review of Systems  Constitutional:  Negative for fever.  Respiratory:  Negative for shortness of breath.   Cardiovascular:  Negative for chest pain.  Musculoskeletal:  Negative for arthralgias.  Skin:  Negative for rash.    Objective:  BP 106/66   Pulse 71   Temp 98 F (36.7 C)   Ht 5' 10 (1.778 m)   Wt 191 lb (86.6 kg)   SpO2 95%   BMI 27.41 kg/m   BP Readings from Last 3 Encounters:  03/04/24 106/66  08/21/23 124/72  07/18/23 129/67    Wt Readings from Last 3 Encounters:  03/04/24 191 lb (86.6 kg)  08/21/23 212 lb 9.6 oz (96.4 kg)  07/18/23 207 lb 6.4 oz (94.1 kg)     Physical Exam Vitals reviewed.  Constitutional:      Appearance: He is well-developed.  HENT:     Head: Normocephalic and atraumatic.     Right  Ear: External ear normal.     Left Ear: External ear normal.     Mouth/Throat:     Pharynx: No oropharyngeal exudate or posterior oropharyngeal erythema.   Eyes:     Pupils: Pupils are equal, round, and reactive to light.    Cardiovascular:     Rate and Rhythm: Normal rate and regular rhythm.     Heart sounds: No murmur heard. Pulmonary:     Effort: No respiratory distress.     Breath sounds: Normal breath sounds.   Musculoskeletal:     Cervical back: Normal range of motion and neck supple.   Neurological:     Mental Status: He is alert and oriented to person, place, and time.      Assessment & Plan:  Gastroesophageal reflux disease with esophagitis without hemorrhage -     CMP14+EGFR -     CBC with Differential/Platelet  Immunization due -     Heplisav-B (HepB-CPG)  Vaccine  Vitamin D  deficiency -     VITAMIN D  25 Hydroxy (Vit-D Deficiency, Fractures)  Recurrent major depressive disorder, in full remission (HCC) -     CMP14+EGFR -     CBC with Differential/Platelet  Other orders -     Sertraline  HCl; Take 2 tablets (200 mg total) by mouth at bedtime. For anxiety and depression  Dispense: 180 tablet; Refill: 3     Follow-up: Return in about 6 months (around 09/04/2024) for [facility] physical.  Butler Der, M.D.

## 2024-03-05 LAB — CMP14+EGFR
ALT: 27 IU/L (ref 0–44)
AST: 21 IU/L (ref 0–40)
Albumin: 4.8 g/dL (ref 4.1–5.1)
Alkaline Phosphatase: 94 IU/L (ref 44–121)
BUN/Creatinine Ratio: 19 (ref 9–20)
BUN: 17 mg/dL (ref 6–24)
Bilirubin Total: 0.9 mg/dL (ref 0.0–1.2)
CO2: 22 mmol/L (ref 20–29)
Calcium: 10.2 mg/dL (ref 8.7–10.2)
Chloride: 97 mmol/L (ref 96–106)
Creatinine, Ser: 0.89 mg/dL (ref 0.76–1.27)
Globulin, Total: 3.1 g/dL (ref 1.5–4.5)
Glucose: 84 mg/dL (ref 70–99)
Potassium: 4.5 mmol/L (ref 3.5–5.2)
Sodium: 137 mmol/L (ref 134–144)
Total Protein: 7.9 g/dL (ref 6.0–8.5)
eGFR: 108 mL/min/1.73 (ref >59)

## 2024-03-05 LAB — CBC WITH DIFFERENTIAL/PLATELET
Basophils Absolute: 0.1 10*3/uL (ref 0.0–0.2)
Basos: 1 %
EOS (ABSOLUTE): 0.3 10*3/uL (ref 0.0–0.4)
Eos: 4 %
Hematocrit: 51.9 % — ABNORMAL HIGH (ref 37.5–51.0)
Hemoglobin: 17.4 g/dL (ref 13.0–17.7)
Immature Grans (Abs): 0 x10E3/uL (ref 0.0–0.1)
Immature Granulocytes: 0 %
Lymphocytes Absolute: 3.2 x10E3/uL — ABNORMAL HIGH (ref 0.7–3.1)
Lymphs: 34 %
MCH: 31.9 pg (ref 26.6–33.0)
MCHC: 33.5 g/dL (ref 31.5–35.7)
MCV: 95 fL (ref 79–97)
Monocytes Absolute: 0.7 10*3/uL (ref 0.1–0.9)
Monocytes: 7 %
Neutrophils Absolute: 5.1 x10E3/uL (ref 1.4–7.0)
Neutrophils: 54 %
Platelets: 274 x10E3/uL (ref 150–450)
RBC: 5.46 x10E6/uL (ref 4.14–5.80)
RDW: 13 % (ref 11.6–15.4)
WBC: 9.4 x10E3/uL (ref 3.4–10.8)

## 2024-03-05 LAB — VITAMIN D 25 HYDROXY (VIT D DEFICIENCY, FRACTURES): Vit D, 25-Hydroxy: 23.3 ng/mL — ABNORMAL LOW (ref 30.0–100.0)

## 2024-03-13 ENCOUNTER — Ambulatory Visit: Payer: Self-pay | Admitting: Family Medicine

## 2024-03-13 NOTE — Progress Notes (Signed)
Dear Keilon Divelbiss, Your Vitamin D is  low. You need a prescription strength supplement I will send that in for you. Nurse, if at all possible, could you send in a prescription for the patient for vitamin D 50,000 units, 1 p.o. weekly #13 with 3 refills? Many thanks, WS

## 2024-04-04 ENCOUNTER — Ambulatory Visit

## 2024-07-17 ENCOUNTER — Other Ambulatory Visit: Payer: Self-pay | Admitting: *Deleted

## 2024-07-18 ENCOUNTER — Other Ambulatory Visit: Payer: Self-pay | Admitting: *Deleted

## 2024-08-04 ENCOUNTER — Encounter: Payer: Self-pay | Admitting: Family Medicine

## 2024-08-19 ENCOUNTER — Encounter: Payer: Self-pay | Admitting: Family Medicine

## 2024-08-19 ENCOUNTER — Ambulatory Visit: Admitting: Family Medicine

## 2024-08-19 VITALS — BP 128/72 | HR 77 | Temp 97.5°F | Ht 70.0 in | Wt 208.0 lb

## 2024-08-19 DIAGNOSIS — N401 Enlarged prostate with lower urinary tract symptoms: Secondary | ICD-10-CM

## 2024-08-19 DIAGNOSIS — Z23 Encounter for immunization: Secondary | ICD-10-CM | POA: Diagnosis not present

## 2024-08-19 DIAGNOSIS — E559 Vitamin D deficiency, unspecified: Secondary | ICD-10-CM | POA: Diagnosis not present

## 2024-08-19 DIAGNOSIS — Z114 Encounter for screening for human immunodeficiency virus [HIV]: Secondary | ICD-10-CM

## 2024-08-19 DIAGNOSIS — K2100 Gastro-esophageal reflux disease with esophagitis, without bleeding: Secondary | ICD-10-CM

## 2024-08-19 DIAGNOSIS — R6882 Decreased libido: Secondary | ICD-10-CM

## 2024-08-19 DIAGNOSIS — R351 Nocturia: Secondary | ICD-10-CM | POA: Diagnosis not present

## 2024-08-19 DIAGNOSIS — Z1159 Encounter for screening for other viral diseases: Secondary | ICD-10-CM

## 2024-08-19 DIAGNOSIS — Z0000 Encounter for general adult medical examination without abnormal findings: Secondary | ICD-10-CM

## 2024-08-19 DIAGNOSIS — Z0001 Encounter for general adult medical examination with abnormal findings: Secondary | ICD-10-CM | POA: Diagnosis not present

## 2024-08-19 MED ORDER — TADALAFIL 5 MG PO TABS: 5.0000 mg | ORAL_TABLET | Freq: Every day | ORAL | 11 refills | Status: AC

## 2024-08-19 NOTE — Progress Notes (Signed)
 Subjective:  Patient ID: Rodney Ruiz, male    DOB: 04/27/1981  Age: 44 y.o. MRN: [id number]  CC: Annual Exam (Pt is having heartburn for the last month. Still consistently taking Protonix .)   HPI  Discussed the use of AI scribe software for clinical note transcription with the patient, who gave verbal consent to proceed.  History of Present Illness Rodney Ruiz is a 43 year old male who presents for a routine physical exam and evaluation of heartburn and erectile dysfunction.  He experiences heartburn on three occasions recently, despite taking pantoprazole  daily. The first episode was prolonged, requiring two Tums and a glass of milk for relief, while the subsequent episodes were short-lived and resolved without intervention. He was previously prone to heartburn before starting pantoprazole .  He has erectile dysfunction, specifically difficulty maintaining an erection during intercourse and sometimes difficulty achieving an erection. He self-prescribes tadalafil  5 mg daily, which he finds effective. He also notes a reduction in nocturia since starting tadalafil , previously waking two to three times a night to urinate.  He continues to take sertraline  for depression, finding it effective and noting that attempts to discontinue it have led to worsening symptoms.  He has been taking vitamin D  once a week but missed doses recently, last taking it approximately two weeks ago.  He is due for the second dose of the hepatitis B vaccine, having received the first dose in July. He is also due for HIV and hepatitis C screening.  He mentions a family history of macular degeneration, with his father diagnosed with a rare form leading to near blindness. His father was diagnosed in 2008 and had to retire early due to vision loss.  He travels frequently for work, which impacts his ability to exercise regularly. He uses a treadmill when staying overnight in hotels but does not have one at home.           08/21/2023    4:34 PM 07/18/2023    3:31 PM 12/13/2022    4:09 PM  Depression screen PHQ 2/9  Decreased Interest 0 0 1  Down, Depressed, Hopeless 0 0 1  PHQ - 2 Score 0 0 2  Altered sleeping 0  2  Tired, decreased energy 1  0  Change in appetite 0  0  Feeling bad or failure about yourself  0  0  Trouble concentrating 0  0  Moving slowly or fidgety/restless 0  0  Suicidal thoughts 0  0  PHQ-9 Score 1   4   Difficult doing work/chores Somewhat difficult  Somewhat difficult     Data saved with a previous flowsheet row definition    History Ledger has a past medical history of Constipation, Educated about COVID-19 virus infection (09/30/2019), Mass of finger of right hand (11/2015), and Panic attacks.   He has a past surgical history that includes Nailbed repair (Right, 08/20/2015); Closed reduction finger with percutaneous pinning (Right, 08/20/2015); and Mass excision (Right, 12/09/2015).   His family history includes Cancer in his mother.He reports that he quit smoking about 5 years ago. His smoking use included cigarettes. He started smoking about 28 years ago. He has a 40.6 pack-year smoking history. He has never used smokeless tobacco. He reports that he does not drink alcohol and does not use drugs.    ROS Review of Systems  Constitutional: Negative.  Negative for activity change, fatigue and unexpected weight change.  HENT: Negative.  Negative for congestion, ear pain, hearing loss, postnasal drip and trouble  swallowing.   Eyes:  Negative for pain and visual disturbance.  Respiratory:  Negative for cough, chest tightness and shortness of breath.   Cardiovascular:  Negative for chest pain, palpitations and leg swelling.  Gastrointestinal:  Negative for abdominal distention, abdominal pain, blood in stool, constipation, diarrhea, nausea and vomiting.  Endocrine: Negative for cold intolerance, heat intolerance and polydipsia.  Genitourinary:  Positive for frequency  (with 2X nocturia). Negative for difficulty urinating, dysuria, flank pain, testicular pain and urgency.  Musculoskeletal:  Negative for arthralgias, joint swelling and myalgias.  Skin:  Negative for color change, rash and wound.  Neurological:  Negative for dizziness, syncope, speech difficulty, weakness, light-headedness, numbness and headaches.  Hematological:  Does not bruise/bleed easily.  Psychiatric/Behavioral:  Negative for confusion, decreased concentration, dysphoric mood and sleep disturbance. The patient is not nervous/anxious.     Objective:  BP 128/72   Pulse 77   Temp (!) 97.5 F (36.4 C)   Ht 5' 10 (1.778 m)   Wt 208 lb (94.3 kg)   SpO2 96%   BMI 29.84 kg/m   BP Readings from Last 3 Encounters:  08/19/24 128/72  03/04/24 106/66  08/21/23 124/72    Wt Readings from Last 3 Encounters:  08/19/24 208 lb (94.3 kg)  03/04/24 191 lb (86.6 kg)  08/21/23 212 lb 9.6 oz (96.4 kg)     Physical Exam Constitutional:      Appearance: He is well-developed.  HENT:     Head: Normocephalic and atraumatic.  Eyes:     Pupils: Pupils are equal, round, and reactive to light.  Neck:     Thyroid: No thyromegaly.     Trachea: No tracheal deviation.  Cardiovascular:     Rate and Rhythm: Normal rate and regular rhythm.     Heart sounds: Normal heart sounds. No murmur heard.    No friction rub. No gallop.  Pulmonary:     Breath sounds: Normal breath sounds. No wheezing or rales.  Abdominal:     General: Bowel sounds are normal. There is no distension.     Palpations: Abdomen is soft. There is no mass.     Tenderness: There is no abdominal tenderness.     Hernia: There is no hernia in the left inguinal area.  Genitourinary:    Penis: Uncircumcised. No paraphimosis, hypospadias, erythema, tenderness, discharge or lesions.      Testes: Normal.  Musculoskeletal:        General: Normal range of motion.     Cervical back: Normal range of motion.  Lymphadenopathy:      Cervical: No cervical adenopathy.  Skin:    General: Skin is warm and dry.  Neurological:     Mental Status: He is alert and oriented to person, place, and time.    Physical Exam MEASUREMENTS: Height- 5'10, Weight- 208.4. GENERAL: Alert, cooperative, well developed, no acute distress HEENT: Normocephalic, normal oropharynx, moist mucous membranes CHEST: Clear to auscultation bilaterally, no wheezes, rhonchi, or crackles CARDIOVASCULAR: Normal heart rate and rhythm, S1 and S2 normal without murmurs ABDOMEN: Soft, non-tender, non-distended, without organomegaly, normal bowel sounds RECTAL: Normal EXTREMITIES: No cyanosis or edema NEUROLOGICAL: Cranial nerves grossly intact, moves all extremities without gross motor or sensory deficit SKIN: Subcutaneous nodules present   Assessment & Plan:  Well adult exam -     VITAMIN D  25 Hydroxy (Vit-D Deficiency, Fractures) -     Testosterone ,Free and Total -     HIV Antibody (routine testing w rflx) -  Hepatitis C antibody -     CBC with Differential/Platelet -     CMP14+EGFR -     Lipid panel -     PSA, total and free -     Urinalysis  Loss of libido -     Testosterone ,Free and Total  BPH associated with nocturia -     CBC with Differential/Platelet -     CMP14+EGFR -     PSA, total and free -     Urinalysis -     Tadalafil ; Take 1 tablet (5 mg total) by mouth daily.  Dispense: 30 tablet; Refill: 11  Vitamin D  deficiency -     VITAMIN D  25 Hydroxy (Vit-D Deficiency, Fractures)  Gastroesophageal reflux disease with esophagitis without hemorrhage -     CBC with Differential/Platelet -     CMP14+EGFR  Encounter for screening for HIV -     HIV Antibody (routine testing w rflx)  Need for hepatitis C screening test -     Hepatitis C antibody  Immunization due -     Heplisav-B  (HepB-CPG) Vaccine    Assessment and Plan Assessment & Plan Loss of libido   He reports difficulty maintaining erections and initiating  intercourse, possibly due to age, weight gain, or stress. Tadalafil  5 mg daily is effective. Discussed its benefits for erectile function and prostate health, noting it does not affect testosterone  levels. Continue tadalafil  5 mg daily. Ordered testosterone  level to assess for deficiency.  Benign prostatic hyperplasia with lower urinary tract symptoms   He experiences nocturia, previously improved with tadalafil . Discussed tadalafil 's role in reducing prostate swelling and improving urinary symptoms. Continue tadalafil  5 mg daily.  Gastroesophageal reflux disease with esophagitis   He experiences intermittent heartburn, managed with pantoprazole . Recent episodes resolved with Tums. Discussed temporarily increasing pantoprazole  dosage to manage symptoms. Increased pantoprazole  to twice daily for a few weeks, then return to once daily.  Vitamin D  deficiency   He has not been taking vitamin D  supplements regularly, with the last dose taken approximately two weeks ago. Plan to recheck vitamin D  levels as part of routine blood work. Ordered vitamin D  level as part of blood work.  Subcutaneous nodules, trunk   He reports subcutaneous nodules on the trunk, present for a long time with one recent addition. Nodules are palpable but not painful. Continue to monitor subcutaneous nodules for changes.  Overweight   He weighs 208.4 lbs at a height of 5'10. Ideal weight range is 175-185 lbs. Discussed benefits of weight loss and muscle building to reduce abdominal fat and improve overall health. Encouraged weight loss and muscle building through exercise. Recommended joining a gym and incorporating weightlifting exercises.  Depression   He is currently on sertraline  with good effect. Attempts to discontinue result in relapse. Discussed importance of continuing medication to prevent relapse. Continue sertraline  as prescribed.  General Health Maintenance   He is due for hepatitis B vaccine series, HIV, and  hepatitis C screening. Discussed importance of regular eye exams and potential hereditary risk for macular degeneration. Administered second dose of hepatitis B vaccine. Ordered HIV and hepatitis C screening. Encouraged regular eye exams.       Follow-up: Return in about 6 months (around 02/17/2025).  Butler Der, M.D.

## 2024-08-20 ENCOUNTER — Ambulatory Visit: Payer: Self-pay | Admitting: Family Medicine

## 2024-08-20 DIAGNOSIS — R6882 Decreased libido: Secondary | ICD-10-CM

## 2024-08-20 LAB — CMP14+EGFR
ALT: 36 IU/L (ref 0–44)
AST: 27 IU/L (ref 0–40)
Albumin: 4.8 g/dL (ref 4.1–5.1)
Alkaline Phosphatase: 97 IU/L (ref 47–123)
BUN/Creatinine Ratio: 17 (ref 9–20)
BUN: 16 mg/dL (ref 6–24)
Bilirubin Total: 0.5 mg/dL (ref 0.0–1.2)
CO2: 25 mmol/L (ref 20–29)
Calcium: 10 mg/dL (ref 8.7–10.2)
Chloride: 100 mmol/L (ref 96–106)
Creatinine, Ser: 0.94 mg/dL (ref 0.76–1.27)
Globulin, Total: 3 g/dL (ref 1.5–4.5)
Glucose: 88 mg/dL (ref 70–99)
Potassium: 4.3 mmol/L (ref 3.5–5.2)
Sodium: 140 mmol/L (ref 134–144)
Total Protein: 7.8 g/dL (ref 6.0–8.5)
eGFR: 102 mL/min/1.73 (ref >59)

## 2024-08-20 LAB — URINALYSIS
Bilirubin, UA: NEGATIVE
Glucose, UA: NEGATIVE
Ketones, UA: NEGATIVE
Leukocytes,UA: NEGATIVE
Nitrite, UA: NEGATIVE
Protein,UA: NEGATIVE
Specific Gravity, UA: 1.02 (ref 1.005–1.030)
Urobilinogen, Ur: 0.2 mg/dL (ref 0.2–1.0)
pH, UA: 6.5 (ref 5.0–7.5)

## 2024-08-20 LAB — LIPID PANEL
Chol/HDL Ratio: 5.6 ratio — ABNORMAL HIGH (ref 0.0–5.0)
Cholesterol, Total: 163 mg/dL (ref 100–199)
HDL: 29 mg/dL — ABNORMAL LOW (ref >39)
LDL Chol Calc (NIH): 91 mg/dL (ref 0–99)
Triglycerides: 254 mg/dL — ABNORMAL HIGH (ref 0–149)
VLDL Cholesterol Cal: 43 mg/dL — ABNORMAL HIGH (ref 5–40)

## 2024-08-20 LAB — CBC WITH DIFFERENTIAL/PLATELET
Basophils Absolute: 0.1 x10E3/uL (ref 0.0–0.2)
Basos: 1 %
EOS (ABSOLUTE): 0.4 x10E3/uL (ref 0.0–0.4)
Eos: 4 %
Hematocrit: 47.6 % (ref 37.5–51.0)
Hemoglobin: 16.7 g/dL (ref 13.0–17.7)
Immature Grans (Abs): 0 x10E3/uL (ref 0.0–0.1)
Immature Granulocytes: 0 %
Lymphocytes Absolute: 3.2 x10E3/uL — ABNORMAL HIGH (ref 0.7–3.1)
Lymphs: 35 %
MCH: 32.6 pg (ref 26.6–33.0)
MCHC: 35.1 g/dL (ref 31.5–35.7)
MCV: 93 fL (ref 79–97)
Monocytes Absolute: 0.7 x10E3/uL (ref 0.1–0.9)
Monocytes: 8 %
Neutrophils Absolute: 4.7 x10E3/uL (ref 1.4–7.0)
Neutrophils: 52 %
Platelets: 301 x10E3/uL (ref 150–450)
RBC: 5.13 x10E6/uL (ref 4.14–5.80)
RDW: 13 % (ref 11.6–15.4)
WBC: 9.1 x10E3/uL (ref 3.4–10.8)

## 2024-08-20 LAB — PSA, TOTAL AND FREE
PSA, Free Pct: 42 %
PSA, Free: 0.21 ng/mL
Prostate Specific Ag, Serum: 0.5 ng/mL (ref 0.0–4.0)

## 2024-08-20 LAB — HIV ANTIBODY (ROUTINE TESTING W REFLEX): HIV Screen 4th Generation wRfx: NONREACTIVE

## 2024-08-20 LAB — TESTOSTERONE,FREE AND TOTAL
Testosterone, Free: 4.7 pg/mL — ABNORMAL LOW (ref 6.8–21.5)
Testosterone: 146 ng/dL — ABNORMAL LOW (ref 264–916)

## 2024-08-20 LAB — HEPATITIS C ANTIBODY: Hep C Virus Ab: NONREACTIVE

## 2024-08-20 LAB — VITAMIN D 25 HYDROXY (VIT D DEFICIENCY, FRACTURES): Vit D, 25-Hydroxy: 20.8 ng/mL — ABNORMAL LOW (ref 30.0–100.0)

## 2024-08-21 ENCOUNTER — Encounter: Payer: Self-pay | Admitting: Family Medicine

## 2024-08-21 ENCOUNTER — Other Ambulatory Visit

## 2024-08-21 ENCOUNTER — Ambulatory Visit: Admitting: Family Medicine

## 2024-08-21 VITALS — BP 133/77 | HR 80 | Ht 70.0 in | Wt 208.0 lb

## 2024-08-21 DIAGNOSIS — R3989 Other symptoms and signs involving the genitourinary system: Secondary | ICD-10-CM

## 2024-08-21 MED ORDER — VALACYCLOVIR HCL 1 G PO TABS: 1000.0000 mg | ORAL_TABLET | Freq: Two times a day (BID) | ORAL | 0 refills | Status: DC

## 2024-08-21 NOTE — Progress Notes (Signed)
 BP 133/77   Pulse 80   Ht 5' 10 (1.778 m)   Wt 208 lb (94.3 kg)   SpO2 96%   BMI 29.84 kg/m    Subjective:   Patient ID: Rodney Ruiz, male    DOB: Sep 14, 1980, 43 y.o.   MRN: [id number]  HPI: Rodney Ruiz is a 43 y.o. male presenting on 08/21/2024 for genital rash (Present a few days. Red and has pain when is urine touches the rash.)   Discussed the use of AI scribe software for clinical note transcription with the patient, who gave verbal consent to proceed.  History of Present Illness   Rodney Ruiz is a 43 year old male who presents with a painful sore on the penis.  Penile lesion - Painful sore on the penis for approximately four days - Pain is exacerbated by retraction of the foreskin, especially during intercourse - Burning sensation when urine contacts the sore, but no dysuria otherwise - No prior history of similar symptoms - Applying triamcinolone  ointment to the area  Sexual history - Monogamous relationship with girlfriend for two years - Denies any other sexual partners  Constitutional symptoms - No fevers or chills          Relevant past medical, surgical, family and social history reviewed and updated as indicated. Interim medical history since our last visit reviewed. Allergies and medications reviewed and updated.  Review of Systems  Constitutional:  Negative for chills and fever.  Eyes:  Negative for visual disturbance.  Respiratory:  Negative for shortness of breath and wheezing.   Cardiovascular:  Negative for chest pain and leg swelling.  Genitourinary:  Positive for genital sores. Negative for dysuria, flank pain, frequency and hematuria.  Musculoskeletal:  Negative for back pain and gait problem.  Skin:  Negative for rash.  All other systems reviewed and are negative.   Per HPI unless specifically indicated above   Allergies as of 08/21/2024   No Known Allergies      Medication List        Accurate as of August 21, 2024  11:46 AM. If you have any questions, ask your nurse or doctor.          pantoprazole  40 MG tablet Commonly known as: PROTONIX  TAKE 1 TABLET BY MOUTH EVERY DAY   sertraline  100 MG tablet Commonly known as: ZOLOFT  Take 2 tablets (200 mg total) by mouth at bedtime. For anxiety and depression   tadalafil  5 MG tablet Commonly known as: CIALIS  Take 1 tablet (5 mg total) by mouth daily.   valACYclovir  1000 MG tablet Commonly known as: VALTREX  Take 1 tablet (1,000 mg total) by mouth 2 (two) times daily for 10 days. Started by: Fonda Levins, MD   Vitamin D  (Ergocalciferol ) 1.25 MG (50000 UNIT) Caps capsule Commonly known as: DRISDOL  TAKE 1 CAPSULE (50,000 UNITS TOTAL) BY MOUTH EVERY 7 (SEVEN) DAYS         Objective:   BP 133/77   Pulse 80   Ht 5' 10 (1.778 m)   Wt 208 lb (94.3 kg)   SpO2 96%   BMI 29.84 kg/m   Wt Readings from Last 3 Encounters:  08/21/24 208 lb (94.3 kg)  08/19/24 208 lb (94.3 kg)  03/04/24 191 lb (86.6 kg)    Physical Exam Physical Exam   GENITOURINARY: Sores present on penis.     3 small ulcerations that are very tender on the dorsum of the shaft of his penis.    Assessment & Plan:  Problem List Items Addressed This Visit   None Visit Diagnoses       Genital sore    -  Primary   Relevant Medications   valACYclovir  (VALTREX ) 1000 MG tablet   Other Relevant Orders   Herpes simplex virus culture   RPR W/RFLX TO RPR TITER, TREPONEMAL AB, SCREEN AND DIAGNOSIS   HepB+HepC+HIV Panel          Penile ulcer Acute penile ulcer likely due to trauma from sexual activity. No systemic symptoms or STD history. - Swabbed ulcer for analysis.          Follow up plan: Return if symptoms worsen or fail to improve.  Counseling provided for all of the vaccine components Orders Placed This Encounter  Procedures   Herpes simplex virus culture   RPR W/RFLX TO RPR TITER, TREPONEMAL AB, SCREEN AND DIAGNOSIS   HepB+HepC+HIV Panel    Fonda Levins, MD [facility] 08/21/2024, 11:46 AM

## 2024-08-21 NOTE — Progress Notes (Signed)
 Dear Rodney Ruiz, Your Vitamin D  is  low. You need to continue the  prescription strength supplement  For Testosterone  to be covered you need to make a lab apointment to have a second testosterone  check to verify the first for insurance purposes  [employer]

## 2024-08-22 ENCOUNTER — Ambulatory Visit: Payer: Self-pay | Admitting: Family Medicine

## 2024-08-22 DIAGNOSIS — R3989 Other symptoms and signs involving the genitourinary system: Secondary | ICD-10-CM

## 2024-08-22 LAB — HEPB+HEPC+HIV PANEL
HIV Screen 4th Generation wRfx: NONREACTIVE
Hep B C IgM: NEGATIVE
Hep B Core Total Ab: NEGATIVE
Hep B E Ab: NONREACTIVE
Hep B E Ag: NEGATIVE
Hep B Surface Ab, Qual: REACTIVE
Hep C Virus Ab: NONREACTIVE
Hepatitis B Surface Ag: NEGATIVE

## 2024-08-22 LAB — SYPHILIS: RPR W/REFLEX TO RPR TITER AND TREPONEMAL ANTIBODIES, TRADITIONAL SCREENING AND DIAGNOSIS ALGORITHM: RPR Ser Ql: NONREACTIVE

## 2024-08-24 LAB — HERPES SIMPLEX VIRUS CULTURE

## 2024-08-26 MED ORDER — VALACYCLOVIR HCL 1 G PO TABS: 1000.0000 mg | ORAL_TABLET | Freq: Two times a day (BID) | ORAL | 0 refills | Status: AC

## 2024-09-03 ENCOUNTER — Other Ambulatory Visit

## 2024-09-03 DIAGNOSIS — R6882 Decreased libido: Secondary | ICD-10-CM

## 2024-09-05 ENCOUNTER — Ambulatory Visit: Payer: Self-pay | Admitting: Family Medicine

## 2024-09-05 LAB — TESTOSTERONE,FREE AND TOTAL
Testosterone, Free: 3.6 pg/mL — ABNORMAL LOW (ref 6.8–21.5)
Testosterone: 131 ng/dL — ABNORMAL LOW (ref 264–916)

## 2024-09-08 ENCOUNTER — Other Ambulatory Visit: Payer: Self-pay

## 2024-09-08 MED ORDER — VITAMIN D (ERGOCALCIFEROL) 1.25 MG (50000 UNIT) PO CAPS: 50000.0000 [IU] | ORAL_CAPSULE | ORAL | 3 refills | Status: AC

## 2024-09-08 NOTE — Progress Notes (Signed)
"  N/a   "

## 2024-09-29 ENCOUNTER — Other Ambulatory Visit: Payer: Self-pay | Admitting: Family Medicine

## 2024-09-29 MED ORDER — TESTOSTERONE CYPIONATE 200 MG/ML IM SOLN: 200.0000 mg | INTRAMUSCULAR | 0 refills | Status: DC

## 2024-09-30 MED ORDER — ANDROGEL 20.25 MG/1.25GM (1.62%) TD GEL: TRANSDERMAL | 5 refills | Status: AC

## 2024-10-07 ENCOUNTER — Other Ambulatory Visit (HOSPITAL_COMMUNITY): Payer: Self-pay

## 2024-10-07 ENCOUNTER — Telehealth: Payer: Self-pay | Admitting: Pharmacy Technician

## 2024-10-10 ENCOUNTER — Other Ambulatory Visit (HOSPITAL_COMMUNITY): Payer: Self-pay

## 2024-10-10 ENCOUNTER — Encounter: Payer: Self-pay | Admitting: *Deleted

## 2025-02-12 ENCOUNTER — Ambulatory Visit: Admitting: Family Medicine

## 2025-02-17 ENCOUNTER — Ambulatory Visit: Admitting: Family Medicine
